# Patient Record
Sex: Female | Born: 1970 | Race: Black or African American | Hispanic: No | Marital: Married | Smoking: Never smoker
Health system: Southern US, Community
[De-identification: ages and names within clinical notes are randomized; demographics above are authoritative.]

## PROBLEM LIST (undated history)

## (undated) DIAGNOSIS — E559 Vitamin D deficiency, unspecified: Secondary | ICD-10-CM

## (undated) DIAGNOSIS — D649 Anemia, unspecified: Secondary | ICD-10-CM

## (undated) DIAGNOSIS — E785 Hyperlipidemia, unspecified: Secondary | ICD-10-CM

## (undated) DIAGNOSIS — M1990 Unspecified osteoarthritis, unspecified site: Secondary | ICD-10-CM

## (undated) DIAGNOSIS — I10 Essential (primary) hypertension: Secondary | ICD-10-CM

## (undated) DIAGNOSIS — K219 Gastro-esophageal reflux disease without esophagitis: Secondary | ICD-10-CM

## (undated) DIAGNOSIS — R42 Dizziness and giddiness: Secondary | ICD-10-CM

## (undated) DIAGNOSIS — O009 Unspecified ectopic pregnancy without intrauterine pregnancy: Secondary | ICD-10-CM

## (undated) HISTORY — DX: Vitamin D deficiency, unspecified: E55.9

## (undated) HISTORY — PX: SALPINGECTOMY: SHX328

## (undated) HISTORY — DX: Dizziness and giddiness: R42

## (undated) HISTORY — PX: OTHER SURGICAL HISTORY: SHX169

## (undated) HISTORY — DX: Essential (primary) hypertension: I10

## (undated) HISTORY — PX: TUBAL LIGATION: SHX77

## (undated) HISTORY — DX: Unspecified ectopic pregnancy without intrauterine pregnancy: O00.90

## (undated) HISTORY — DX: Hyperlipidemia, unspecified: E78.5

---

## 2012-03-17 HISTORY — PX: POLYPECTOMY: SHX149

## 2012-03-17 HISTORY — PX: HYSTEROSCOPY: SHX211

## 2016-07-23 ENCOUNTER — Encounter: Payer: Self-pay | Admitting: Certified Nurse Midwife

## 2016-07-23 ENCOUNTER — Ambulatory Visit (INDEPENDENT_AMBULATORY_CARE_PROVIDER_SITE_OTHER): Payer: 59 | Admitting: Certified Nurse Midwife

## 2016-07-23 VITALS — BP 118/82 | HR 87 | Ht 66.0 in | Wt 202.0 lb

## 2016-07-23 DIAGNOSIS — Z01419 Encounter for gynecological examination (general) (routine) without abnormal findings: Secondary | ICD-10-CM | POA: Diagnosis not present

## 2016-07-23 DIAGNOSIS — R42 Dizziness and giddiness: Secondary | ICD-10-CM | POA: Insufficient documentation

## 2016-07-23 DIAGNOSIS — Z1231 Encounter for screening mammogram for malignant neoplasm of breast: Secondary | ICD-10-CM | POA: Diagnosis not present

## 2016-07-23 DIAGNOSIS — Z124 Encounter for screening for malignant neoplasm of cervix: Secondary | ICD-10-CM | POA: Diagnosis not present

## 2016-07-23 DIAGNOSIS — I10 Essential (primary) hypertension: Secondary | ICD-10-CM | POA: Insufficient documentation

## 2016-07-23 DIAGNOSIS — E559 Vitamin D deficiency, unspecified: Secondary | ICD-10-CM | POA: Insufficient documentation

## 2016-07-23 DIAGNOSIS — E785 Hyperlipidemia, unspecified: Secondary | ICD-10-CM | POA: Insufficient documentation

## 2016-07-23 DIAGNOSIS — Z1239 Encounter for other screening for malignant neoplasm of breast: Secondary | ICD-10-CM

## 2016-07-23 MED ORDER — HYDROCHLOROTHIAZIDE 12.5 MG PO TABS: 12.5000 mg | ORAL_TABLET | Freq: Every day | ORAL | 1 refills | Status: AC

## 2016-07-27 NOTE — Progress Notes (Signed)
Gynecology Annual Exam  PCP: System, Pcp Not In  Chief Complaint:  Chief Complaint  Patient presents with  . Gynecologic Exam    new pt    History of Present Illness: Elizabeth Terry or "[name] is a 46 y.o. [id number] BF who presents for a NP annual exam. The patient has recently moved to the area and is establishing care at [facility].   Last year she reports being amenorrheic. Since the start of this year, her menses have been regular, they occur every month, and they last 4-5 days. Her flow is moderate. She does not have intermenstrual bleeding. Her last menstrual period was 74 April 18. She has dysmenorrhea with most of her cramping on the left lower quadrant. Midol and rest help relieve the pain. She also has headaches associated with her menses. Last pap smear: 04/24/2014, results were negative per patient report. She denies a history of abnormal Pap smears.   The patient is sexually active. She currently uses nothing for contraception. She has a history of infertility and conceived with her last child via IVF. Had a BTL followed by a tubal reanastamosis, then had an ectopic pregnancy and had a left salpingectomy. She does not have dyspareunia.    Her past medical history is remarkable for hyperlipidemia, hypertension, GERD, vertigo, and vitamin D deficiency. She is needing refills of her HCTZ until she establishes care with a PCP.  The patient does perform self breast exams. Her last mammogram was last year, results were normal..   There is no family history of breast cancer.    There is no family history of ovarian cancer.   The patient denies smoking.  She reports drinking alcohol. She reports have occasional glass of wine drinks per week.   She denies illegal drug use.  The patient reports exercising regularly by walking  The patient denies current symptoms of depression.    Review of Systems: ROS  Past Medical History:  Past Medical History:  Diagnosis Date  .  Ectopic pregnancy    tube removed  . Hyperlipidemia   . Hypertension   . Vertigo   . Vitamin D deficiency     Past Surgical History:  Past Surgical History:  Procedure Laterality Date  . HYSTEROSCOPY  03/17/2012  . POLYPECTOMY  03/17/2012   cervical  . SALPINGECTOMY Left    for left ectopic  . TUBAL LIGATION     done in 1994  . tubal reversal      Family History:  Family History  Problem Relation Age of Onset  . Hypertension Mother   . Hyperlipidemia Mother   . Liver cancer Father        died in his 80s  . Prostate cancer Father   . Hypertension Maternal Grandmother     Social History:  Social History   Social History  . Marital status: Married    Spouse name: N/A  . Number of children: 3  . Years of education: N/A   Occupational History  . CNA    Social History Main Topics  . Smoking status: Never Smoker  . Smokeless tobacco: Never Used  . Alcohol use Yes     Comment: occasional glass of wine  . Drug use: No  . Sexual activity: Yes    Birth control/ protection: None   Other Topics Concern  . Not on file   Social History Narrative  . No narrative on file    Allergies:  Allergies  Allergen Reactions  .  Aspirin Nausea And Vomiting    Medications: Prior to Admission medications   Medication Sig Start Date End Date Taking? Authorizing Provider  atorvastatin (LIPITOR) 20 MG tablet Take 20 mg by mouth daily.   Yes [provider]  meclizine (ANTIVERT) 25 MG tablet meclizine 25 mg tablet  Take 1 tablet(s) 3 times a day by oral route with meals.   Yes [provider]  Omeprazole 20 MG TBEC omeprazole 20 mg capsule,delayed release   Yes [provider]  Vitamin D, Ergocalciferol, (DRISDOL) 50000 units CAPS capsule Take 50,000 Units by mouth every 7 (seven) days.   Yes [provider]  hydrochlorothiazide (HYDRODIURIL) 12.5 MG tablet Take 1 tablet (12.5 mg total) by mouth daily. 07/23/16   Dalia Heading, CNM     Physical Exam Vitals: Blood pressure 118/82, pulse 87, height 5\' 6"  (1.676 m), weight 202 lb (91.6 kg), last menstrual period 06/21/2016.  General: NAD HEENT: normocephalic, anicteric Neck: no thyroid enlargement, no palpable nodules, no cervical lymphadenopathy  Pulmonary: No increased work of breathing, CTAB Cardiovascular: RRR, without murmur  Breast: Breast symmetrical, no tenderness, no palpable nodules or masses, no skin or nipple retraction present, no nipple discharge.  No axillary, infraclavicular or supraclavicular lymphadenopathy. Abdomen: Soft, non-tender, non-distended.  Umbilicus without lesions.  No hepatomegaly or masses palpable. No evidence of hernia. Genitourinary:  External: Normal external female genitalia.  Normal urethral meatus, normal Bartholin's and Skene's glands.    Vagina: Normal vaginal mucosa, no evidence of prolapse, blood tinged vaginal discharge    Cervix: Grossly normal in appearance, non-tender  Uterus: RF, normal size, shape, and consistency, decreased mobility, and non-tender  Adnexa: No adnexal masses, non-tender  Rectal: deferred  Lymphatic: no evidence of inguinal lymphadenopathy Extremities: no edema, erythema, or tenderness Neurologic: Grossly intact Psychiatric: mood appropriate, affect full     Assessment: 46 y.o. [id number] well woman exam  Plan:  1) Breast cancer screening - recommend monthly self breast exam. Mammogram was ordered today.  2) STI screening was offered and declined.  3) Cervical cancer screening - Pap was done.   4) Contraception -declines  5) Given names of some PCPs accepting new patients. Refill on HCTZ sent to pharmacy.  Dalia Heading, CNM

## 2016-07-28 ENCOUNTER — Encounter: Payer: Self-pay | Admitting: Certified Nurse Midwife

## 2016-07-29 LAB — IGP, APTIMA HPV
HPV Aptima: NEGATIVE
PAP SMEAR COMMENT: 0

## 2016-09-17 ENCOUNTER — Other Ambulatory Visit: Payer: Self-pay | Admitting: Certified Nurse Midwife

## 2016-09-29 DIAGNOSIS — M779 Enthesopathy, unspecified: Secondary | ICD-10-CM

## 2016-09-29 DIAGNOSIS — M778 Other enthesopathies, not elsewhere classified: Secondary | ICD-10-CM | POA: Insufficient documentation

## 2016-09-29 DIAGNOSIS — R42 Dizziness and giddiness: Secondary | ICD-10-CM | POA: Insufficient documentation

## 2016-09-29 DIAGNOSIS — K219 Gastro-esophageal reflux disease without esophagitis: Secondary | ICD-10-CM | POA: Insufficient documentation

## 2017-08-17 ENCOUNTER — Ambulatory Visit: Payer: BLUE CROSS/BLUE SHIELD | Admitting: Obstetrics and Gynecology

## 2017-08-17 ENCOUNTER — Encounter: Payer: Self-pay | Admitting: Obstetrics and Gynecology

## 2017-08-17 VITALS — BP 128/80 | Ht 66.0 in | Wt 196.0 lb

## 2017-08-17 DIAGNOSIS — N939 Abnormal uterine and vaginal bleeding, unspecified: Secondary | ICD-10-CM

## 2017-08-17 DIAGNOSIS — R102 Pelvic and perineal pain: Secondary | ICD-10-CM | POA: Diagnosis not present

## 2017-08-17 DIAGNOSIS — N760 Acute vaginitis: Secondary | ICD-10-CM

## 2017-08-17 DIAGNOSIS — R300 Dysuria: Secondary | ICD-10-CM

## 2017-08-17 DIAGNOSIS — N959 Unspecified menopausal and perimenopausal disorder: Secondary | ICD-10-CM

## 2017-08-17 LAB — POCT URINALYSIS DIPSTICK
Bilirubin, UA: NEGATIVE
Blood, UA: POSITIVE
GLUCOSE UA: NEGATIVE
KETONES UA: NEGATIVE
Nitrite, UA: NEGATIVE
Protein, UA: NEGATIVE
SPEC GRAV UA: 1.02 (ref 1.010–1.025)
Urobilinogen, UA: 0.2 E.U./dL
pH, UA: 6.5 (ref 5.0–8.0)

## 2017-08-17 MED ORDER — SULFAMETHOXAZOLE-TRIMETHOPRIM 800-160 MG PO TABS: 1.0000 | ORAL_TABLET | Freq: Two times a day (BID) | ORAL | 0 refills | Status: AC

## 2017-08-17 MED ORDER — METRONIDAZOLE 500 MG PO TABS: 500.0000 mg | ORAL_TABLET | Freq: Two times a day (BID) | ORAL | 0 refills | Status: AC

## 2017-08-17 NOTE — Progress Notes (Signed)
   Patient ID: Elizabeth Terry, female   DOB: Jul 01, 1970, 47 y.o.   MRN: [id number]  Reason for Consult: Pelvic Pain and Vaginal Pain   Referred by No ref. provider found  Subjective:     HPI:  Elizabeth Terry is a 47 y.o. female. She reports perimenopausal symptoms, irregular bleeding, left sided pelvic pain, and low sexual desire  Past Medical History:  Diagnosis Date  . Ectopic pregnancy    tube removed  . Hyperlipidemia   . Hypertension   . Vertigo   . Vitamin D deficiency    Family History  Problem Relation Age of Onset  . Hypertension Mother   . Hyperlipidemia Mother   . Liver cancer Father        died in his 14s  . Prostate cancer Father   . Hypertension Maternal Grandmother    Past Surgical History:  Procedure Laterality Date  . HYSTEROSCOPY  03/17/2012  . POLYPECTOMY  03/17/2012   cervical  . SALPINGECTOMY Left    for left ectopic  . TUBAL LIGATION     done in 1994  . tubal reversal      Short Social History:  Social History   Tobacco Use  . Smoking status: Never Smoker  . Smokeless tobacco: Never Used  Substance Use Topics  . Alcohol use: Yes    Comment: occasional glass of wine    Allergies  Allergen Reactions  . Aspirin Nausea And Vomiting    Current Outpatient Medications  Medication Sig Dispense Refill  . atorvastatin (LIPITOR) 20 MG tablet Take 20 mg by mouth daily.    . hydrochlorothiazide (HYDRODIURIL) 12.5 MG tablet Take 1 tablet (12.5 mg total) by mouth daily. 30 tablet 1  . meclizine (ANTIVERT) 25 MG tablet meclizine 25 mg tablet  Take 1 tablet(s) 3 times a day by oral route with meals.    . Omeprazole 20 MG TBEC omeprazole 20 mg capsule,delayed release    . Vitamin D, Ergocalciferol, (DRISDOL) 50000 units CAPS capsule Take 50,000 Units by mouth every 7 (seven) days.     No current facility-administered medications for this visit.     REVIEW OF SYSTEMS      Objective:  Objective   Vitals:   08/17/17 0830  BP: 128/80   Weight: 196 lb (88.9 kg)  Height: 5\' 6"  (1.676 m)   Body mass index is 31.64 kg/m.  Physical Exam     Assessment/Plan:    47 yo  Nuswab VG + Low sexual desire and Perimenopausal symptoms- check fsh and estradiol Irregular bleeding and left sided pelvic pain- follow up for pelvic US, urine culture and nuswab   Adrian Prows MD [place], [employer] [facility] 08/17/17 9:04 AM

## 2017-08-18 ENCOUNTER — Telehealth: Payer: Self-pay | Admitting: Obstetrics and Gynecology

## 2017-08-18 LAB — ESTRADIOL: Estradiol: 58 pg/mL

## 2017-08-18 LAB — FOLLICLE STIMULATING HORMONE: FSH: 39.4 m[IU]/mL

## 2017-08-18 NOTE — Telephone Encounter (Signed)
Attempt to reach patient to schedule TVUS and follow up with Dr. Gilman Schmidt . Patient's voicemail box is full unable to leave message for patient to call back

## 2017-08-18 NOTE — Telephone Encounter (Signed)
Patient is schedule 06/24/17 at 4pm

## 2017-08-19 LAB — URINE CULTURE: Organism ID, Bacteria: NO GROWTH

## 2017-08-22 LAB — NUSWAB VAGINITIS PLUS (VG+)
CHLAMYDIA TRACHOMATIS, NAA: NEGATIVE
Candida albicans, NAA: NEGATIVE
Candida glabrata, NAA: NEGATIVE
Neisseria gonorrhoeae, NAA: NEGATIVE
Trich vag by NAA: POSITIVE — AB

## 2017-08-23 NOTE — Progress Notes (Signed)
Spoke with patient on phone about her test results. She and her partner are both currently being treated for trichomoniasis. Discussed refraining from intercourse for 2 weeks to prevent reinfection. Possible her light pink spotting is related to this infection. She does appear to be in menopause which is consistent with her lack of periods for over a year and a half.

## 2017-08-24 ENCOUNTER — Ambulatory Visit (INDEPENDENT_AMBULATORY_CARE_PROVIDER_SITE_OTHER): Payer: BLUE CROSS/BLUE SHIELD

## 2017-08-24 ENCOUNTER — Encounter: Payer: Self-pay | Admitting: Obstetrics and Gynecology

## 2017-08-24 ENCOUNTER — Ambulatory Visit (INDEPENDENT_AMBULATORY_CARE_PROVIDER_SITE_OTHER): Payer: BLUE CROSS/BLUE SHIELD | Admitting: Obstetrics and Gynecology

## 2017-08-24 VITALS — BP 130/80 | Ht 66.0 in | Wt 195.0 lb

## 2017-08-24 DIAGNOSIS — N760 Acute vaginitis: Secondary | ICD-10-CM | POA: Diagnosis not present

## 2017-08-24 DIAGNOSIS — N959 Unspecified menopausal and perimenopausal disorder: Secondary | ICD-10-CM

## 2017-08-24 DIAGNOSIS — N83209 Unspecified ovarian cyst, unspecified side: Secondary | ICD-10-CM | POA: Diagnosis not present

## 2017-08-24 DIAGNOSIS — N939 Abnormal uterine and vaginal bleeding, unspecified: Secondary | ICD-10-CM

## 2017-08-24 DIAGNOSIS — A599 Trichomoniasis, unspecified: Secondary | ICD-10-CM | POA: Diagnosis not present

## 2017-08-24 NOTE — Progress Notes (Signed)
Patient ID: Elizabeth Terry, female   DOB: [birth year], 47 y.o.   MRN: [id number]  Reason for Consult: Follow-up   Referred by Adrian Prows R, *  Subjective:     HPI:  Elizabeth Terry is a 47 y.o. female  She was seen recently for complaints of postmenopausal bleeding and pelvic pain.  She was evaluated and found to have an infection with trichomonas. She is doing well today without vaginal bleeding since at least May.  Past Medical History:  Diagnosis Date  . Ectopic pregnancy    tube removed  . Hyperlipidemia   . Hypertension   . Vertigo   . Vitamin D deficiency    Family History  Problem Relation Age of Onset  . Hypertension Mother   . Hyperlipidemia Mother   . Liver cancer Father        died in his 52s  . Prostate cancer Father   . Hypertension Maternal Grandmother    Past Surgical History:  Procedure Laterality Date  . HYSTEROSCOPY  03/17/2012  . POLYPECTOMY  03/17/2012   cervical  . SALPINGECTOMY Left    for left ectopic  . TUBAL LIGATION     done in 1994  . tubal reversal      Short Social History:  Social History   Tobacco Use  . Smoking status: Never Smoker  . Smokeless tobacco: Never Used  Substance Use Topics  . Alcohol use: Yes    Comment: occasional glass of wine    Allergies  Allergen Reactions  . Aspirin Nausea And Vomiting    Current Outpatient Medications  Medication Sig Dispense Refill  . atorvastatin (LIPITOR) 20 MG tablet Take 20 mg by mouth daily.    . hydrochlorothiazide (HYDRODIURIL) 12.5 MG tablet Take 1 tablet (12.5 mg total) by mouth daily. 30 tablet 1  . meclizine (ANTIVERT) 25 MG tablet meclizine 25 mg tablet  Take 1 tablet(s) 3 times a day by oral route with meals.    . metroNIDAZOLE (FLAGYL) 500 MG tablet Take 1 tablet (500 mg total) by mouth 2 (two) times daily for 7 days. 14 tablet 0  . Omeprazole 20 MG TBEC omeprazole 20 mg capsule,delayed release    . Vitamin D, Ergocalciferol, (DRISDOL) 50000 units CAPS capsule  Take 50,000 Units by mouth every 7 (seven) days.     No current facility-administered medications for this visit.     Review of Systems  Constitutional: Negative for chills, fatigue, fever and unexpected weight change.  HENT: Negative for trouble swallowing.  Eyes: Negative for loss of vision.  Respiratory: Negative for cough, shortness of breath and wheezing.  Cardiovascular: Negative for chest pain, leg swelling, palpitations and syncope.  GI: Negative for abdominal pain, blood in stool, diarrhea, nausea and vomiting.  GU: Negative for difficulty urinating, dysuria, frequency and hematuria.  Musculoskeletal: Negative for back pain, leg pain and joint pain.  Skin: Negative for rash.  Neurological: Negative for dizziness, headaches, light-headedness, numbness and seizures.  Psychiatric: Negative for behavioral problem, confusion, depressed mood and sleep disturbance.        Objective:  Objective   Vitals:   08/24/17 1649  BP: 130/80  Weight: 195 lb (88.5 kg)  Height: 5\' 6"  (1.676 m)   Body mass index is 31.47 kg/m.  Physical Exam  Constitutional: She is oriented to person, place, and time. She appears well-developed and well-nourished.  HENT:  Head: Normocephalic and atraumatic.  Eyes: Pupils are equal, round, and reactive to light. EOM are normal.  Cardiovascular:  Normal rate and regular rhythm.  Pulmonary/Chest: Effort normal. No respiratory distress.  Abdominal: Soft.  Neurological: She is alert and oriented to person, place, and time.  Skin: Skin is warm and dry.  Psychiatric: She has a normal mood and affect. Her behavior is normal. Judgment and thought content normal.  Nursing note and vitals reviewed.   US Transvaginal Non-ob  Result Date: 08/24/2017 ULTRASOUND REPORT Location: [facility] Date of Service: 08/24/2017 Indications:AUB Findings: The uterus is retroverted and measures 8.14 x 4.61 x 4.16 cm. Echo texture is heterogenous with evidence of focal  masses. Within the uterus is a suspected fibroid measuring: Fibroid 1: 1.25 x 1.02 cm, Right fundal, Intramural The Endometrium measures 2.58 mm. Right Ovary measures 4.97 x 3.32 x 3.03 cm and contains a simple cyst measuring 3.14 x 2.91 x 2.33 cm. Left Ovary measures 2.71 x 2.44 x 1.82 cm. It is normal in appearance. Survey of the adnexa demonstrates no adnexal masses. There is trace free fluid in the cul de sac. Impression: 1. Retroverted fibroid uterus 2. Right fundal, intramural fibroid measures 1.25 x 1.02 cm 3. Right ovarian simple cyst measures 3.14 x 2.91 x 2.33 cm 4. Trace free fluid in the cul de sac. Recommendations: 1.Clinical correlation with the patient's History and Physical Exam. 2. Follow up in 3 months for repeat ultrasound of simple ovarian cyst. Dario Ave, RDMS I have reviewed this ultrasound and the report. I agree with the above assessment and plan. [facility] 08/24/17 5:56 PM         Assessment/Plan:    47yo with postmenopausal bleeding likely secondary to infection.  She has started treatment for the trichomonas infection.  Her partner is also being treated.  She understands that she should refrain from intercourse for 2 weeks to prevent reinfection with this organism.  She had a pelvic ultrasound today that confirmed that her uterine lining was thin being 2.5 mm.  This makes any source of uterine cancer for her bleeding very unlikely.  If any bleeding is persistent after her infection has been treated she will return to care.  Pelvic ultrasound today also found a simple cyst on her ovary.  We will follow this up with a repeat ultrasound in 3 months at that time she will also be tested for trichomonas to make sure that she is clear this infection.  More than 25 minutes were spent face to face with the patient in the room with more than 50% of the time spent providing counseling and discussing the plan of management. We discussed  the results of her evaluation, causes of postmenopausal bleeding, and follow up for her simple cyst.    Adrian Prows MD [place] 08/24/17 7:18 PM

## 2017-08-26 ENCOUNTER — Telehealth: Payer: Self-pay | Admitting: Obstetrics and Gynecology

## 2017-08-26 NOTE — Telephone Encounter (Signed)
Patient is calling due to missed call please advise  

## 2017-11-01 ENCOUNTER — Encounter: Payer: Self-pay | Admitting: Podiatry

## 2017-11-01 ENCOUNTER — Ambulatory Visit (INDEPENDENT_AMBULATORY_CARE_PROVIDER_SITE_OTHER): Payer: BLUE CROSS/BLUE SHIELD

## 2017-11-01 ENCOUNTER — Other Ambulatory Visit: Payer: Self-pay | Admitting: Podiatry

## 2017-11-01 ENCOUNTER — Ambulatory Visit: Payer: BLUE CROSS/BLUE SHIELD | Admitting: Podiatry

## 2017-11-01 DIAGNOSIS — M722 Plantar fascial fibromatosis: Secondary | ICD-10-CM

## 2017-11-01 DIAGNOSIS — N841 Polyp of cervix uteri: Secondary | ICD-10-CM | POA: Insufficient documentation

## 2017-11-01 DIAGNOSIS — M79672 Pain in left foot: Secondary | ICD-10-CM | POA: Diagnosis not present

## 2017-11-01 DIAGNOSIS — N920 Excessive and frequent menstruation with regular cycle: Secondary | ICD-10-CM | POA: Insufficient documentation

## 2017-11-01 DIAGNOSIS — R102 Pelvic and perineal pain: Secondary | ICD-10-CM | POA: Insufficient documentation

## 2017-11-01 DIAGNOSIS — M79671 Pain in right foot: Secondary | ICD-10-CM

## 2017-11-01 MED ORDER — MELOXICAM 15 MG PO TABS: 15.0000 mg | ORAL_TABLET | Freq: Every day | ORAL | 1 refills | Status: AC

## 2017-11-03 NOTE — Progress Notes (Signed)
   Subjective: 47 year old female presenting today as a new patient with a chief complaint of intermittent pain of bilateral arches that began 5-6 years ago. She states she has flat feet and the pain is worse on the right foot. Standing for long periods of time and walking increases the pain. She has not done anything for treatment. Patient is here for further evaluation and treatment.   Past Medical History:  Diagnosis Date  . Ectopic pregnancy    tube removed  . Hyperlipidemia   . Hypertension   . Vertigo   . Vitamin D deficiency      Objective: Physical Exam General: The patient is alert and oriented x3 in no acute distress.  Dermatology: Skin is warm, dry and supple bilateral lower extremities. Negative for open lesions or macerations bilateral.   Vascular: Dorsalis Pedis and Posterior Tibial pulses palpable bilateral.  Capillary fill time is immediate to all digits.  Neurological: Epicritic and protective threshold intact bilateral.   Musculoskeletal: Tenderness to palpation to the plantar aspect of the bilateral heels along the plantar fascia. All other joints range of motion within normal limits bilateral. Strength 5/5 in all groups bilateral.   Radiographic exam: Normal osseous mineralization. Joint spaces preserved. No fracture/dislocation/boney destruction. No other soft tissue abnormalities or radiopaque foreign bodies.   Assessment: 1. plantar fasciitis bilateral feet  Plan of Care:  1. Patient evaluated. Xrays reviewed.   2. Injection of 0.5cc Celestone soluspan injected into the bilateral heels.  3. Rx for Meloxicam provided to patient.  4. Appointment with Liliane Channel for custom molded orthotics.  5. Return to clinic in 4 weeks.    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   Edrick Kins, DPM [facility]  Dr. Edrick Kins, DPM    [address], [place] [address]                Office [phone]  Fax [phone]

## 2017-11-23 ENCOUNTER — Ambulatory Visit (INDEPENDENT_AMBULATORY_CARE_PROVIDER_SITE_OTHER): Payer: BLUE CROSS/BLUE SHIELD | Admitting: Orthotics

## 2017-11-23 DIAGNOSIS — M722 Plantar fascial fibromatosis: Secondary | ICD-10-CM

## 2017-11-23 NOTE — Progress Notes (Signed)

## 2017-11-25 ENCOUNTER — Ambulatory Visit: Payer: BLUE CROSS/BLUE SHIELD | Admitting: Obstetrics and Gynecology

## 2017-11-25 ENCOUNTER — Encounter: Payer: Self-pay | Admitting: Obstetrics and Gynecology

## 2017-11-25 ENCOUNTER — Ambulatory Visit (INDEPENDENT_AMBULATORY_CARE_PROVIDER_SITE_OTHER): Payer: BLUE CROSS/BLUE SHIELD

## 2017-11-25 DIAGNOSIS — N83209 Unspecified ovarian cyst, unspecified side: Secondary | ICD-10-CM

## 2017-11-25 DIAGNOSIS — D251 Intramural leiomyoma of uterus: Secondary | ICD-10-CM | POA: Diagnosis not present

## 2017-11-29 ENCOUNTER — Ambulatory Visit: Payer: BLUE CROSS/BLUE SHIELD | Admitting: Podiatry

## 2017-11-29 ENCOUNTER — Encounter: Payer: Self-pay | Admitting: Podiatry

## 2017-11-29 DIAGNOSIS — M722 Plantar fascial fibromatosis: Secondary | ICD-10-CM | POA: Diagnosis not present

## 2017-11-29 NOTE — Progress Notes (Signed)
   Subjective: 47 year old female presenting today for follow up evaluation of plantar fasciitis of bilateral feet. She states the pain had improved until she went on vacation. She states once she returned the pain started again. She has been taking Meloxicam but states it does not help. Walking increases the pain. Patient is here for further evaluation and treatment.   Past Medical History:  Diagnosis Date  . Ectopic pregnancy    tube removed  . Hyperlipidemia   . Hypertension   . Vertigo   . Vitamin D deficiency      Objective: Physical Exam General: The patient is alert and oriented x3 in no acute distress.  Dermatology: Skin is warm, dry and supple bilateral lower extremities. Negative for open lesions or macerations bilateral.   Vascular: Dorsalis Pedis and Posterior Tibial pulses palpable bilateral.  Capillary fill time is immediate to all digits.  Neurological: Epicritic and protective threshold intact bilateral.   Musculoskeletal: Tenderness to palpation to the plantar aspect of the bilateral heels along the plantar fascia. All other joints range of motion within normal limits bilateral. Strength 5/5 in all groups bilateral.    Assessment: 1. plantar fasciitis bilateral feet  Plan of Care:  1. Patient evaluated.   2. Injection of 0.5cc Celestone soluspan injected into the bilateral heels.  3. Discontinue taking Meloxicam because it did not help.  4. Patient has been molded for custom orthotics.  5. Return to clinic in 4 weeks.    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   Edrick Kins, DPM [facility]  Dr. Edrick Kins, DPM    [address], [place] [address]                Office [phone]  Fax [phone]

## 2017-12-14 ENCOUNTER — Other Ambulatory Visit: Payer: BLUE CROSS/BLUE SHIELD | Admitting: Orthotics

## 2017-12-21 ENCOUNTER — Ambulatory Visit: Payer: BLUE CROSS/BLUE SHIELD | Admitting: Orthotics

## 2017-12-21 DIAGNOSIS — R102 Pelvic and perineal pain: Secondary | ICD-10-CM

## 2017-12-21 DIAGNOSIS — M722 Plantar fascial fibromatosis: Secondary | ICD-10-CM

## 2017-12-21 NOTE — Progress Notes (Signed)
Patient came in today to pick up custom made foot orthotics.  The goals were accomplished and the patient reported no dissatisfaction with said orthotics.  Patient was advised of breakin period and how to report any issues. 

## 2017-12-28 ENCOUNTER — Other Ambulatory Visit: Payer: BLUE CROSS/BLUE SHIELD | Admitting: Orthotics

## 2018-01-23 ENCOUNTER — Other Ambulatory Visit: Payer: Self-pay | Admitting: Internal Medicine

## 2018-01-23 DIAGNOSIS — R748 Abnormal levels of other serum enzymes: Secondary | ICD-10-CM

## 2018-01-27 ENCOUNTER — Ambulatory Visit
Admission: RE | Admit: 2018-01-27 | Discharge: 2018-01-27 | Disposition: A | Discharge status: Home / Self Care | Payer: BLUE CROSS/BLUE SHIELD | Source: Ambulatory Visit | Attending: Internal Medicine | Admitting: Internal Medicine

## 2018-01-27 DIAGNOSIS — R748 Abnormal levels of other serum enzymes: Secondary | ICD-10-CM | POA: Insufficient documentation

## 2018-02-13 ENCOUNTER — Encounter: Payer: Self-pay | Admitting: Podiatry

## 2018-02-13 ENCOUNTER — Ambulatory Visit (INDEPENDENT_AMBULATORY_CARE_PROVIDER_SITE_OTHER): Payer: BLUE CROSS/BLUE SHIELD | Admitting: Podiatry

## 2018-02-13 DIAGNOSIS — M25572 Pain in left ankle and joints of left foot: Secondary | ICD-10-CM | POA: Diagnosis not present

## 2018-02-13 DIAGNOSIS — M722 Plantar fascial fibromatosis: Secondary | ICD-10-CM

## 2018-02-13 NOTE — Progress Notes (Addendum)
This patient presents the office with chief complaint of painful heels both feet.  She was initially diagnosed with plantar fasciitis by Dr. Amalia Hailey and treated with injection therapy.  She says that the injections only lasted for 2 days before the pain returned.  Dr. Amalia Hailey also acquired orthoses for this patient to wear in her shoes.  She says that the pain continues to be present in her left foot more than her right foot.  She states that she experiences burning pain noted through the bottom of her right foot.  She says she is unable to take Mobic because the cause her stomach to become upset.  She says that her feet continue to worsen despite wearing her orthoses and changing her foot gear at work.  She says her feet become painful due to her walking at work.  She presents the office today requesting injections for the pain.  Vascular  Dorsalis pedis and posterior tibial pulses are palpable  B/L.  Capillary return  WNL.  Temperature gradient is  WNL.  Skin turgor  WNL  Sensorium  Senn Weinstein monofilament wire  WNL. Normal tactile sensation.  Nail Exam  Patient has normal nails with no evidence of bacterial or fungal infection.  Orthopedic  Exam  Muscle tone and muscle strength  WNL.  No limitations of motion feet  B/L.  No crepitus or joint effusion noted.  Foot type is unremarkable and digits show no abnormalities.  Prominent rear foot  revealing a medial bulge at the talonavicular joint.  Patient has palpable pain noted at the insertion of the plantar fascia left foot.  Patient also has severe pain upon palpation of the sinus tarsi which she was unaware of on her left foot. Ganglion over lateral malleolar left foot/ankle.  Skin  No open lesions.  Normal skin texture and turgor.    Plantar fasciitis left foot.  Sinus tarsitis left foot.  ROV.  Injection sinus tarsi left foot.  Injection at the insertion of the plantar fascia left foot.  Examined the orthoses that patient has in the car and they  seem appropriate for her foot type.  She was told to continue to work wearing her orthoses and to return to the office as needed for continued evaluation and treatment.  Gardiner Barefoot DPM

## 2018-03-14 DIAGNOSIS — E876 Hypokalemia: Secondary | ICD-10-CM | POA: Insufficient documentation

## 2018-12-13 ENCOUNTER — Other Ambulatory Visit: Payer: Self-pay

## 2018-12-13 ENCOUNTER — Telehealth: Payer: Self-pay | Admitting: Gastroenterology

## 2018-12-13 ENCOUNTER — Encounter: Payer: Self-pay | Admitting: Certified Nurse Midwife

## 2018-12-13 ENCOUNTER — Ambulatory Visit (INDEPENDENT_AMBULATORY_CARE_PROVIDER_SITE_OTHER): Payer: PRIVATE HEALTH INSURANCE | Admitting: Certified Nurse Midwife

## 2018-12-13 ENCOUNTER — Other Ambulatory Visit (HOSPITAL_COMMUNITY)
Admission: RE | Admit: 2018-12-13 | Discharge: 2018-12-13 | Disposition: A | Discharge status: Home / Self Care | Payer: PRIVATE HEALTH INSURANCE | Source: Ambulatory Visit | Attending: Certified Nurse Midwife | Admitting: Certified Nurse Midwife

## 2018-12-13 ENCOUNTER — Telehealth: Payer: Self-pay | Admitting: Certified Nurse Midwife

## 2018-12-13 VITALS — BP 120/80 | HR 96 | Ht 66.0 in | Wt 210.0 lb

## 2018-12-13 DIAGNOSIS — Z1231 Encounter for screening mammogram for malignant neoplasm of breast: Secondary | ICD-10-CM

## 2018-12-13 DIAGNOSIS — Z01419 Encounter for gynecological examination (general) (routine) without abnormal findings: Secondary | ICD-10-CM

## 2018-12-13 DIAGNOSIS — N898 Other specified noninflammatory disorders of vagina: Secondary | ICD-10-CM

## 2018-12-13 DIAGNOSIS — Z124 Encounter for screening for malignant neoplasm of cervix: Secondary | ICD-10-CM

## 2018-12-13 DIAGNOSIS — Z1211 Encounter for screening for malignant neoplasm of colon: Secondary | ICD-10-CM

## 2018-12-13 LAB — POCT WET PREP (WET MOUNT): Trichomonas Wet Prep HPF POC: ABSENT

## 2018-12-13 MED ORDER — TINIDAZOLE 500 MG PO TABS: 1000.0000 mg | ORAL_TABLET | Freq: Every day | ORAL | 0 refills | Status: AC

## 2018-12-13 NOTE — Telephone Encounter (Signed)
Pt aware.

## 2018-12-13 NOTE — Telephone Encounter (Signed)
Patient following up, Rx still not at pharmacy, if these could be sent asap.  [facility].

## 2018-12-13 NOTE — Telephone Encounter (Signed)
Returned patients call to schedule her for her colonoscopy.  Unable to contact her or LVM due to no answer.  Thanks [employer]

## 2018-12-13 NOTE — Telephone Encounter (Signed)
done

## 2018-12-13 NOTE — Telephone Encounter (Signed)
Pt left vm returning Michelle's call

## 2018-12-13 NOTE — Telephone Encounter (Signed)
Patient was seen today for annual. Patient is calling for the prescriptions to be sent in to [facility]. Please advise

## 2018-12-13 NOTE — Progress Notes (Signed)
Gynecology Annual Exam  PCP: Elizabeth Axe, MD  Chief Complaint:  Chief Complaint  Patient presents with  . Gynecologic Exam    premenopause; hot flashes    History of Present Illness: Elizabeth Terry or "Elizabeth Terry is a 48 y.o. [id number] BF who presents for a routine gyn exam.  She also reports noticing an intermittent vaginal odor since December 2019. No vaginal discharge or itching. She has been amenorrheic for over a year now.  Last pap smear: 07/23/2016, results were NIL/negative HRHPV. She denies a history of abnormal Pap smears.   The patient is sexually active. She currently uses nothing for contraception. She has a history of infertility and conceived with her last child via IVF. Had a BTL followed by a tubal reanastamosis, then had an ectopic pregnancy and had a left salpingectomy. She does not have dyspareunia. Since her last annual exam 07/23/2016, she was seen by Dr Elizabeth Terry last year for left sided pelvic pain and was noted to have a left ovarian cyst, that resolved on a subsequent pelvic ultrasound. She also had 2 small intramural fibroids   Her past medical history is remarkable for hyperlipidemia, hypertension, GERD, vertigo, and vitamin D deficiency.   The patient does perform self breast exams. Her last mammogram was about 3 years ago.  There is no family history of breast cancer.    There is no family history of ovarian cancer. Her father had colon cancer at age 15 and later died of liver cancer in his early 106s. She had a colonoscopy in her 25s for blood in her stool was was thought to be due to a stomach ulcer.   The patient denies smoking.  She reports drinking alcohol. She reports have occasional glass of wine drinks per week.   She denies illegal drug use.  The patient reports exercising regularly by walking  The patient denies current symptoms of depression.    Review of Systems: Review of Systems  Constitutional: Positive for malaise/fatigue. Negative  for chills, fever and weight loss.  HENT: Positive for congestion. Negative for sinus pain and sore throat.   Eyes: Negative for blurred vision and pain.  Respiratory: Negative for hemoptysis, shortness of breath and wheezing.   Cardiovascular: Negative for chest pain, palpitations and leg swelling.  Gastrointestinal: Positive for abdominal pain (epigastric and LUQ) and nausea. Negative for blood in stool, diarrhea, heartburn and vomiting.  Genitourinary: Negative for dysuria, frequency, hematuria and urgency.  Musculoskeletal: Negative for back pain, joint pain and myalgias.  Skin: Negative for itching and rash.  Neurological: Negative for dizziness, tingling and headaches.  Endo/Heme/Allergies: Positive for environmental allergies. Negative for polydipsia. Does not bruise/bleed easily.       Positive for hot flashes   Psychiatric/Behavioral: Negative for depression. The patient is nervous/anxious. The patient does not have insomnia.     Past Medical History:  Past Medical History:  Diagnosis Date  . Ectopic pregnancy    tube removed  . Hyperlipidemia   . Hypertension   . Vertigo   . Vitamin D deficiency     Past Surgical History:  Past Surgical History:  Procedure Laterality Date  . HYSTEROSCOPY  03/17/2012  . POLYPECTOMY  03/17/2012   cervical  . SALPINGECTOMY Left    for left ectopic  . TUBAL LIGATION     done in 1994  . tubal reversal      Family History:  Family History  Problem Relation Age of Onset  . Hypertension Mother   .  Hyperlipidemia Mother   . Kidney Stones Mother   . Liver cancer Father        died in his 73s  . Prostate cancer Father   . Colon cancer Father   . Hypertension Maternal Grandmother     Social History:  Social History   Socioeconomic History  . Marital status: Married    Spouse name: Not on file  . Number of children: 3  . Years of education: Not on file  . Highest education level: Not on file  Occupational History  .  Occupation: CNA  Social Needs  . Financial resource strain: Not on file  . Food insecurity    Worry: Not on file    Inability: Not on file  . Transportation needs    Medical: Not on file    Non-medical: Not on file  Tobacco Use  . Smoking status: Never Smoker  . Smokeless tobacco: Never Used  Substance and Sexual Activity  . Alcohol use: Yes    Comment: occasional glass of wine  . Drug use: No  . Sexual activity: Yes    Birth control/protection: None  Lifestyle  . Physical activity    Days per week: Not on file    Minutes per session: Not on file  . Stress: Not on file  Relationships  . Social Herbalist on phone: Not on file    Gets together: Not on file    Attends religious service: Not on file    Active member of club or organization: Not on file    Attends meetings of clubs or organizations: Not on file    Relationship status: Not on file  . Intimate partner violence    Fear of current or ex partner: Not on file    Emotionally abused: Not on file    Physically abused: Not on file    Forced sexual activity: Not on file  Other Topics Concern  . Not on file  Social History Narrative  . Not on file    Allergies:  Allergies  Allergen Reactions  . Grapefruit Extract Swelling  . Aspirin Nausea And Vomiting    Medications:  Current Outpatient Medications on File Prior to Visit  Medication Sig Dispense Refill  . hydrochlorothiazide (HYDRODIURIL) 12.5 MG tablet Take 1 tablet (12.5 mg total) by mouth daily. 30 tablet 1  . meclizine (ANTIVERT) 25 MG tablet meclizine 25 mg tablet  Take 1 tablet(s) 3 times a day by oral route with meals.    . Omeprazole 20 MG TBEC omeprazole 20 mg capsule,delayed release    . Vitamin D, Ergocalciferol, (DRISDOL) 50000 units CAPS capsule Take 50,000 Units by mouth every 7 (seven) days.     No current facility-administered medications on file prior to visit.     Physical Exam Vitals: BP 120/80   Pulse 96   Ht 5\' 6"  (1.676  m)   Wt 210 lb (95.3 kg)   BMI 33.89 kg/m   General: AAF in NAD HEENT: normocephalic, anicteric Neck: no thyroid enlargement, no palpable nodules, no cervical lymphadenopathy  Pulmonary: No increased work of breathing, CTAB Cardiovascular: RRR, without murmur  Breast: Breast symmetrical, no tenderness, no palpable nodules or masses, no skin or nipple retraction present, no nipple discharge.  No axillary, infraclavicular or supraclavicular lymphadenopathy. Abdomen: Soft,  non-distended.  Umbilicus without lesions.  No hepatomegaly or masses palpable. No evidence of hernia. Genitourinary:  External: Normal external female genitalia.  Normal urethral meatus, normal Bartholin's and Skene's  glands.    Vagina: Normal vaginal mucosa, no evidence of prolapse, white homogenous discharge    Cervix: Grossly normal in appearance, non-tender  Uterus: RF, normal size, shape, and consistency, decreased mobility, and non-tender  Adnexa: No adnexal masses, non-tender  Rectal: deferred  Lymphatic: no evidence of inguinal lymphadenopathy Extremities: no edema, erythema, or tenderness Neurologic: Grossly intact Psychiatric: mood appropriate, affect full  Wet prep: negative for hyphae and Trich and positive for many clue cells   Assessment: 48 y.o. [id number] well woman exam Bacterial vaginosis  Plan:  1) Breast cancer screening - recommend monthly self breast exam. Mammogram was ordered today.  2) Colon cancer screening/ family history of colon cancer: GI referral for colonoscopy  3) Cervical cancer screening - Pap was done.   4) Tindamax 1 GM po daily x 5 days ( no alcohol x 1 week)  5) RTO 1 year and prn.  Dalia Heading, CNM

## 2018-12-18 ENCOUNTER — Encounter: Payer: Self-pay | Admitting: Certified Nurse Midwife

## 2018-12-18 LAB — CYTOLOGY - PAP: Diagnosis: NEGATIVE

## 2018-12-27 ENCOUNTER — Other Ambulatory Visit: Payer: Self-pay

## 2018-12-27 ENCOUNTER — Telehealth: Payer: Self-pay

## 2018-12-27 NOTE — Telephone Encounter (Signed)
Gastroenterology Pre-Procedure Review  Request Date: PENDING PT TO CALL BACK TO SCHEDULE Requesting Physician: Dr. PENDING ([place])  PATIENT REVIEW QUESTIONS: The patient responded to the following health history questions as indicated:    1. Are you having any GI issues? no 2. Do you have a personal history of Polyps? no 3. Do you have a family history of Colon Cancer or Polyps? yes (Father had colon cancer) 4. Diabetes Mellitus? no 5. Joint replacements in the past 12 months?no 6. Major health problems in the past 3 months?no 7. Any artificial heart valves, MVP, or defibrillator?no    MEDICATIONS & ALLERGIES:    Patient reports the following regarding taking any anticoagulation/antiplatelet therapy:   Plavix, Coumadin, Eliquis, Xarelto, Lovenox, Pradaxa, Brilinta, or Effient? no Aspirin? no  Patient confirms/reports the following medications:  Current Outpatient Medications  Medication Sig Dispense Refill  . hydrochlorothiazide (HYDRODIURIL) 12.5 MG tablet Take 1 tablet (12.5 mg total) by mouth daily. 30 tablet 1  . meclizine (ANTIVERT) 25 MG tablet meclizine 25 mg tablet  Take 1 tablet(s) 3 times a day by oral route with meals.    . Omeprazole 20 MG TBEC omeprazole 20 mg capsule,delayed release    . Vitamin D, Ergocalciferol, (DRISDOL) 50000 units CAPS capsule Take 50,000 Units by mouth every 7 (seven) days.     No current facility-administered medications for this visit.     Patient confirms/reports the following allergies:  Allergies  Allergen Reactions  . Grapefruit Extract Swelling  . Aspirin Nausea And Vomiting    No orders of the defined types were placed in this encounter.   AUTHORIZATION INFORMATION Primary Insurance: 1D#: Group #:  Secondary Insurance: 1D#: Group #:  SCHEDULE INFORMATION: Date: PENDING Time: Location:[facility]

## 2018-12-29 ENCOUNTER — Encounter: Payer: Self-pay | Admitting: *Deleted

## 2019-04-18 ENCOUNTER — Other Ambulatory Visit: Payer: Self-pay | Admitting: Orthopedic Surgery

## 2019-04-18 DIAGNOSIS — M25561 Pain in right knee: Secondary | ICD-10-CM

## 2019-04-18 NOTE — Progress Notes (Signed)
Bilateral standing hip to ankle x-rays on one cassette for evaluation of alignment 

## 2019-04-23 ENCOUNTER — Ambulatory Visit
Admission: RE | Admit: 2019-04-23 | Discharge: 2019-04-23 | Disposition: A | Discharge status: Home / Self Care | Payer: No Typology Code available for payment source | Source: Ambulatory Visit | Attending: Orthopedic Surgery | Admitting: Orthopedic Surgery

## 2019-04-23 DIAGNOSIS — M25561 Pain in right knee: Secondary | ICD-10-CM

## 2019-05-23 ENCOUNTER — Other Ambulatory Visit: Payer: Self-pay | Admitting: Orthopedic Surgery

## 2019-05-28 ENCOUNTER — Other Ambulatory Visit: Payer: Self-pay

## 2019-05-28 ENCOUNTER — Encounter
Admission: RE | Admit: 2019-05-28 | Discharge: 2019-05-28 | Disposition: A | Discharge status: Home / Self Care | Payer: No Typology Code available for payment source | Source: Ambulatory Visit | Attending: Orthopedic Surgery | Admitting: Orthopedic Surgery

## 2019-05-28 DIAGNOSIS — Z01812 Encounter for preprocedural laboratory examination: Secondary | ICD-10-CM | POA: Insufficient documentation

## 2019-05-28 DIAGNOSIS — Z20822 Contact with and (suspected) exposure to covid-19: Secondary | ICD-10-CM | POA: Diagnosis not present

## 2019-05-28 HISTORY — DX: Unspecified osteoarthritis, unspecified site: M19.90

## 2019-05-28 HISTORY — DX: Anemia, unspecified: D64.9

## 2019-05-28 HISTORY — DX: Gastro-esophageal reflux disease without esophagitis: K21.9

## 2019-05-28 NOTE — Patient Instructions (Addendum)
INSTRUCTIONS FOR SURGERY     Your surgery is scheduled for:   Monday, MARCH 29TH     To find out your arrival time for the day of surgery,          please call [phone] between 1 pm and 3 pm on :  Friday, MARCH 26TH     When you arrive for surgery, report to the [place].       Do NOT stop on the first floor to register.    REMEMBER: Instructions that are not followed completely may result in serious medical risk,  up to and including death, or upon the discretion of your surgeon and anesthesiologist,            your surgery may need to be rescheduled.  __X__ 1. Do not eat food after midnight the night before your procedure.                    No gum, candy, lozenger, tic tacs, tums or hard candies.                  ABSOLUTELY NOTHING SOLID IN YOUR MOUTH AFTER MIDNIGHT                    You may drink unlimited clear liquids up to 2 hours before you are scheduled to arrive for surgery.                   Do not drink anything within those 2 hours unless you need to take medicine, then take the                   smallest amount you need.  Clear liquids include:  water, apple juice without pulp,                   any flavor Gatorade, Black coffee, black tea.  Sugar may be added but no dairy/ honey /lemon.                        Broth and jello is not considered a clear liquid.  __x__  2. On the morning of surgery, please brush your teeth with toothpaste and water. You may rinse with                  mouthwash if you wish but DO NOT SWALLOW TOOTHPASTE OR MOUTHWASH  __X___3. NO alcohol for 24 hours before or after surgery.  __x___ 4.  Do NOT smoke or use e-cigarettes for 24 HOURS PRIOR TO SURGERY.                      DO NOT  Use any chewable tobacco products for at least 6 hours prior to surgery.  __x___ 5. If you start any new medication after this appointment and prior to surgery, please                    Bring it with you on the day of surgery.  ___x__ 6. Notify your doctor if there is any change  in your medical condition, such as fever,                   infection, vomitting, diarrhea or any open sores.  __x___ 7.  USE the CHG SOAP as instructed, the night before surgery and the day of surgery.                   Once you have washed with this soap, do NOT use any of the following: Powders, perfumes                    or lotions. Please do not wear make up, hairpins, clips or nail polish. You  May wear deodorant.                   Men may shave their face and neck.  Women need to shave 48 hours prior to surgery.                   DO NOT wear ANY jewelry on the day of surgery. If there are rings that are too tight to                    remove easily, please address this prior to the surgery day. Piercings need to be removed.                                                                     NO METAL ON YOUR BODY.                    Do NOT bring any valuables.  If you came to Pre-Admit testing then you will not need license,                     insurance card or credit card.  If you will be staying overnight, please either leave your things in                     the car or have your family be responsible for these items.                     [facility] IS NOT RESPONSIBLE FOR BELONGINGS OR VALUABLES.  ___X__ 8. DO NOT wear contact lenses on surgery day.  You may not have dentures,                     Hearing aides, contacts or glasses in the operating room. These items can be                    Placed in the Recovery Room to receive immediately after surgery.  __x___ 9. IF YOU ARE SCHEDULED TO GO HOME ON THE SAME DAY, YOU MUST                   Have someone to drive you home and to stay with you  for the first 24 hours.                    Have an arrangement prior to arriving on surgery day.  ___x__ 10. Take the following  medications on the morning of surgery with a sip of water:                               1. Meclizine/antivert                     2. omeprazole                     3.                     _x____ 11.  Follow any instructions provided to you by your surgeon.                        Such as enema, clear liquid bowel prep                         ##PLEASE DRINK THE PRESURGICAL CARBOHYDRATE DRINK ON THE                              MORNING OF SURGERY. HAVE IT COMPLETED 2 HOURS PRIOR TO                             ARRIVAL TO [place].##  __X__  12. STOP ALL ASPIRIN PRODUCTS AS OF: TODAY, MARCH 22ND                       THIS INCLUDES BC POWDERS / GOODIES POWDER  __x___ 13. STOP Anti-inflammatories as of:  TODAY, MARCH 22ND                      This includes IBUPROFEN / MOTRIN / ADVIL / [place].  __X___ 50.  Stop supplements until after surgery.                     This includes: MULTIVITAMINS                 You may continue taking Vitamin B12 / Vitamin D3 but do not take on the morning of surgery.  __X____17.  Continue to take the following medications but do not take on the morning of surgery:                         HYDROCHLOROTHIAZIDE  _____18.   Wear clean and comfortable clothing to the hospital.                   [place].  PLEASE BRING BRACE WITH YOU TO THE HOSPITAL.  BRING IT IN WHEN YOU ARRIVE. BRING PHONE NUMBERS FOR YOUR CONTACT PEOPLE.

## 2019-05-31 ENCOUNTER — Other Ambulatory Visit: Payer: Self-pay

## 2019-05-31 ENCOUNTER — Other Ambulatory Visit
Admission: RE | Admit: 2019-05-31 | Discharge: 2019-05-31 | Disposition: A | Discharge status: Home / Self Care | Payer: No Typology Code available for payment source | Source: Ambulatory Visit | Attending: Orthopedic Surgery | Admitting: Orthopedic Surgery

## 2019-05-31 DIAGNOSIS — Z01812 Encounter for preprocedural laboratory examination: Secondary | ICD-10-CM | POA: Diagnosis not present

## 2019-05-31 LAB — SARS CORONAVIRUS 2 (TAT 6-24 HRS): SARS Coronavirus 2: NEGATIVE

## 2019-06-04 ENCOUNTER — Ambulatory Visit: Payer: No Typology Code available for payment source | Admitting: Anesthesiology

## 2019-06-04 ENCOUNTER — Encounter
Admission: RE | Disposition: A | Discharge status: Home / Self Care | Payer: Self-pay | Source: Home / Self Care | Attending: Orthopedic Surgery

## 2019-06-04 ENCOUNTER — Observation Stay: Payer: No Typology Code available for payment source

## 2019-06-04 ENCOUNTER — Ambulatory Visit: Payer: No Typology Code available for payment source

## 2019-06-04 ENCOUNTER — Other Ambulatory Visit: Payer: Self-pay

## 2019-06-04 ENCOUNTER — Encounter: Payer: Self-pay | Admitting: Orthopedic Surgery

## 2019-06-04 ENCOUNTER — Observation Stay
Admission: RE | Admit: 2019-06-04 | Discharge: 2019-06-05 | Disposition: A | Discharge status: Home / Self Care | Payer: No Typology Code available for payment source | Attending: Orthopedic Surgery | Admitting: Orthopedic Surgery

## 2019-06-04 DIAGNOSIS — X58XXXA Exposure to other specified factors, initial encounter: Secondary | ICD-10-CM | POA: Insufficient documentation

## 2019-06-04 DIAGNOSIS — M21161 Varus deformity, not elsewhere classified, right knee: Secondary | ICD-10-CM | POA: Insufficient documentation

## 2019-06-04 DIAGNOSIS — M21169 Varus deformity, not elsewhere classified, unspecified knee: Secondary | ICD-10-CM | POA: Diagnosis present

## 2019-06-04 DIAGNOSIS — I10 Essential (primary) hypertension: Secondary | ICD-10-CM | POA: Diagnosis not present

## 2019-06-04 DIAGNOSIS — M1990 Unspecified osteoarthritis, unspecified site: Secondary | ICD-10-CM | POA: Diagnosis not present

## 2019-06-04 DIAGNOSIS — Z9889 Other specified postprocedural states: Secondary | ICD-10-CM

## 2019-06-04 DIAGNOSIS — S83241A Other tear of medial meniscus, current injury, right knee, initial encounter: Secondary | ICD-10-CM | POA: Diagnosis not present

## 2019-06-04 DIAGNOSIS — E785 Hyperlipidemia, unspecified: Secondary | ICD-10-CM | POA: Insufficient documentation

## 2019-06-04 DIAGNOSIS — Z419 Encounter for procedure for purposes other than remedying health state, unspecified: Secondary | ICD-10-CM

## 2019-06-04 HISTORY — PX: KNEE ARTHROSCOPY WITH MENISCAL REPAIR: SHX5653

## 2019-06-04 SURGERY — ARTHROSCOPY, KNEE, WITH MENISCUS REPAIR
Anesthesia: General | Site: Knee | Laterality: Right

## 2019-06-04 MED ORDER — ROCURONIUM BROMIDE 100 MG/10ML IV SOLN
INTRAVENOUS | Status: DC | PRN
  Administered 2019-06-04: 40 mg via INTRAVENOUS
  Administered 2019-06-04: 20 mg via INTRAVENOUS

## 2019-06-04 MED ORDER — METHOCARBAMOL 500 MG PO TABS
500.0000 mg | ORAL_TABLET | Freq: Four times a day (QID) | ORAL | Status: DC | PRN
  Administered 2019-06-04: 500 mg via ORAL
  Filled 2019-06-04: qty 1

## 2019-06-04 MED ORDER — PANTOPRAZOLE SODIUM 40 MG PO TBEC
40.0000 mg | DELAYED_RELEASE_TABLET | Freq: Every day | ORAL | Status: DC
  Administered 2019-06-05: 40 mg via ORAL
  Filled 2019-06-04: qty 1

## 2019-06-04 MED ORDER — ONDANSETRON HCL 4 MG PO TABS: 4.0000 mg | ORAL_TABLET | Freq: Four times a day (QID) | ORAL | Status: DC | PRN

## 2019-06-04 MED ORDER — BUPIVACAINE HCL (PF) 0.5 % IJ SOLN
INTRAMUSCULAR | Status: AC
  Filled 2019-06-04: qty 30

## 2019-06-04 MED ORDER — ACETAMINOPHEN 10 MG/ML IV SOLN: 1000.0000 mg | Freq: Once | INTRAVENOUS | Status: DC | PRN

## 2019-06-04 MED ORDER — FAMOTIDINE 20 MG PO TABS: 20.0000 mg | ORAL_TABLET | Freq: Once | ORAL | Status: AC

## 2019-06-04 MED ORDER — DEXAMETHASONE SODIUM PHOSPHATE 4 MG/ML IJ SOLN
INTRAMUSCULAR | Status: AC
  Filled 2019-06-04: qty 1

## 2019-06-04 MED ORDER — VITAMIN D 25 MCG (1000 UNIT) PO TABS
1000.0000 [IU] | ORAL_TABLET | Freq: Every day | ORAL | Status: DC
  Administered 2019-06-04 – 2019-06-05 (×2): 1000 [IU] via ORAL
  Filled 2019-06-04 (×2): qty 1

## 2019-06-04 MED ORDER — KETAMINE HCL 10 MG/ML IJ SOLN
INTRAMUSCULAR | Status: DC | PRN
  Administered 2019-06-04: 20 mg via INTRAVENOUS
  Administered 2019-06-04 (×2): 10 mg via INTRAVENOUS

## 2019-06-04 MED ORDER — HYDROMORPHONE HCL 1 MG/ML IJ SOLN: 0.2500 mg | INTRAMUSCULAR | Status: DC | PRN

## 2019-06-04 MED ORDER — ACETAMINOPHEN 500 MG PO TABS
1000.0000 mg | ORAL_TABLET | Freq: Three times a day (TID) | ORAL | Status: DC
  Administered 2019-06-04 – 2019-06-05 (×3): 1000 mg via ORAL
  Filled 2019-06-04 (×3): qty 2

## 2019-06-04 MED ORDER — FENTANYL CITRATE (PF) 100 MCG/2ML IJ SOLN
INTRAMUSCULAR | Status: AC
  Filled 2019-06-04: qty 2

## 2019-06-04 MED ORDER — LIDOCAINE HCL (CARDIAC) PF 100 MG/5ML IV SOSY
PREFILLED_SYRINGE | INTRAVENOUS | Status: DC | PRN
  Administered 2019-06-04: 100 mg via INTRAVENOUS

## 2019-06-04 MED ORDER — ADULT MULTIVITAMIN W/MINERALS CH
1.0000 | ORAL_TABLET | Freq: Every day | ORAL | Status: DC
  Administered 2019-06-04 – 2019-06-05 (×2): 1 via ORAL
  Filled 2019-06-04 (×2): qty 1

## 2019-06-04 MED ORDER — OXYCODONE HCL 5 MG PO TABS: 2.5000 mg | ORAL_TABLET | ORAL | Status: DC | PRN

## 2019-06-04 MED ORDER — ASPIRIN EC 325 MG PO TBEC
325.0000 mg | DELAYED_RELEASE_TABLET | Freq: Every day | ORAL | Status: DC
  Administered 2019-06-05: 325 mg via ORAL
  Filled 2019-06-04: qty 1

## 2019-06-04 MED ORDER — PHENYLEPHRINE HCL (PRESSORS) 10 MG/ML IV SOLN
INTRAVENOUS | Status: DC | PRN
  Administered 2019-06-04: 150 ug via INTRAVENOUS
  Administered 2019-06-04: 100 ug via INTRAVENOUS
  Administered 2019-06-04 (×2): 150 ug via INTRAVENOUS

## 2019-06-04 MED ORDER — PROPOFOL 10 MG/ML IV BOLUS
INTRAVENOUS | Status: DC | PRN
  Administered 2019-06-04: 150 mg via INTRAVENOUS
  Administered 2019-06-04: 20 mg via INTRAVENOUS

## 2019-06-04 MED ORDER — BUPIVACAINE LIPOSOME 1.3 % IJ SUSP
INTRAMUSCULAR | Status: DC | PRN
  Administered 2019-06-04: 20 mL

## 2019-06-04 MED ORDER — DEXMEDETOMIDINE HCL 200 MCG/2ML IV SOLN
INTRAVENOUS | Status: DC | PRN
  Administered 2019-06-04: 20 ug via INTRAVENOUS

## 2019-06-04 MED ORDER — METHOCARBAMOL 1000 MG/10ML IJ SOLN
500.0000 mg | Freq: Four times a day (QID) | INTRAVENOUS | Status: DC | PRN
  Filled 2019-06-04: qty 5

## 2019-06-04 MED ORDER — ONDANSETRON HCL 4 MG/2ML IJ SOLN: 4.0000 mg | Freq: Four times a day (QID) | INTRAMUSCULAR | Status: DC | PRN

## 2019-06-04 MED ORDER — ACETAMINOPHEN 10 MG/ML IV SOLN
INTRAVENOUS | Status: AC
  Filled 2019-06-04: qty 100

## 2019-06-04 MED ORDER — FENTANYL CITRATE (PF) 100 MCG/2ML IJ SOLN
INTRAMUSCULAR | Status: AC
  Administered 2019-06-04: 25 ug via INTRAVENOUS
  Filled 2019-06-04: qty 2

## 2019-06-04 MED ORDER — FENTANYL CITRATE (PF) 100 MCG/2ML IJ SOLN
25.0000 ug | INTRAMUSCULAR | Status: DC | PRN
  Administered 2019-06-04: 50 ug via INTRAVENOUS
  Administered 2019-06-04: 25 ug via INTRAVENOUS

## 2019-06-04 MED ORDER — SUCCINYLCHOLINE CHLORIDE 200 MG/10ML IV SOSY
PREFILLED_SYRINGE | INTRAVENOUS | Status: AC
  Filled 2019-06-04: qty 10

## 2019-06-04 MED ORDER — CHLORHEXIDINE GLUCONATE 4 % EX LIQD
60.0000 mL | Freq: Once | CUTANEOUS | Status: AC
  Administered 2019-06-04: 4 via TOPICAL

## 2019-06-04 MED ORDER — DEXAMETHASONE SODIUM PHOSPHATE 4 MG/ML IJ SOLN
INTRAMUSCULAR | Status: DC | PRN
  Administered 2019-06-04: 4 mg

## 2019-06-04 MED ORDER — FENTANYL CITRATE (PF) 100 MCG/2ML IJ SOLN
INTRAMUSCULAR | Status: DC | PRN
  Administered 2019-06-04 (×3): 50 ug via INTRAVENOUS
  Administered 2019-06-04: 100 ug via INTRAVENOUS
  Administered 2019-06-04: 50 ug via INTRAVENOUS

## 2019-06-04 MED ORDER — FLEET ENEMA 7-19 GM/118ML RE ENEM: 1.0000 | ENEMA | Freq: Once | RECTAL | Status: DC | PRN

## 2019-06-04 MED ORDER — SUCCINYLCHOLINE CHLORIDE 20 MG/ML IJ SOLN
INTRAMUSCULAR | Status: DC | PRN
  Administered 2019-06-04: 100 mg via INTRAVENOUS

## 2019-06-04 MED ORDER — FENTANYL CITRATE (PF) 100 MCG/2ML IJ SOLN
INTRAMUSCULAR | Status: AC
  Administered 2019-06-04: 50 ug via INTRAVENOUS
  Filled 2019-06-04: qty 2

## 2019-06-04 MED ORDER — MIDAZOLAM HCL 2 MG/2ML IJ SOLN
INTRAMUSCULAR | Status: DC | PRN
  Administered 2019-06-04: 2 mg via INTRAVENOUS

## 2019-06-04 MED ORDER — BUPIVACAINE HCL (PF) 0.5 % IJ SOLN
INTRAMUSCULAR | Status: DC | PRN
  Administered 2019-06-04: 30 mL

## 2019-06-04 MED ORDER — BUPIVACAINE LIPOSOME 1.3 % IJ SUSP
INTRAMUSCULAR | Status: AC
  Filled 2019-06-04: qty 20

## 2019-06-04 MED ORDER — CEFAZOLIN SODIUM-DEXTROSE 2-4 GM/100ML-% IV SOLN
2.0000 g | INTRAVENOUS | Status: AC
  Administered 2019-06-04: 2 g via INTRAVENOUS

## 2019-06-04 MED ORDER — EPHEDRINE SULFATE 50 MG/ML IJ SOLN
INTRAMUSCULAR | Status: DC | PRN
  Administered 2019-06-04: 10 mg via INTRAVENOUS

## 2019-06-04 MED ORDER — BUPIVACAINE HCL (PF) 0.5 % IJ SOLN
INTRAMUSCULAR | Status: AC
  Filled 2019-06-04: qty 10

## 2019-06-04 MED ORDER — DOCUSATE SODIUM 100 MG PO CAPS
100.0000 mg | ORAL_CAPSULE | Freq: Two times a day (BID) | ORAL | Status: DC
  Administered 2019-06-04 – 2019-06-05 (×2): 100 mg via ORAL
  Filled 2019-06-04 (×2): qty 1

## 2019-06-04 MED ORDER — HYDROXYZINE HCL 10 MG PO TABS
10.0000 mg | ORAL_TABLET | Freq: Three times a day (TID) | ORAL | Status: DC | PRN
  Filled 2019-06-04: qty 1

## 2019-06-04 MED ORDER — HYDROMORPHONE HCL 1 MG/ML IJ SOLN
INTRAMUSCULAR | Status: AC
  Administered 2019-06-04: 0.5 mg via INTRAVENOUS
  Filled 2019-06-04: qty 1

## 2019-06-04 MED ORDER — LACTATED RINGERS IV SOLN: INTRAVENOUS | Status: DC

## 2019-06-04 MED ORDER — PROPOFOL 10 MG/ML IV BOLUS
INTRAVENOUS | Status: AC
  Filled 2019-06-04: qty 20

## 2019-06-04 MED ORDER — OXYCODONE HCL 5 MG/5ML PO SOLN: 5.0000 mg | Freq: Once | ORAL | Status: DC | PRN

## 2019-06-04 MED ORDER — METOCLOPRAMIDE HCL 5 MG/ML IJ SOLN
5.0000 mg | Freq: Three times a day (TID) | INTRAMUSCULAR | Status: DC | PRN
  Administered 2019-06-04: 10 mg via INTRAVENOUS
  Filled 2019-06-04: qty 2

## 2019-06-04 MED ORDER — FAMOTIDINE 20 MG PO TABS
ORAL_TABLET | ORAL | Status: AC
  Administered 2019-06-04: 20 mg via ORAL
  Filled 2019-06-04: qty 1

## 2019-06-04 MED ORDER — BUPIVACAINE HCL 0.5 % IJ SOLN
INTRAMUSCULAR | Status: DC | PRN
  Administered 2019-06-04: 30 mL

## 2019-06-04 MED ORDER — ROCURONIUM BROMIDE 10 MG/ML (PF) SYRINGE
PREFILLED_SYRINGE | INTRAVENOUS | Status: AC
  Filled 2019-06-04: qty 10

## 2019-06-04 MED ORDER — VITAMIN D (ERGOCALCIFEROL) 1.25 MG (50000 UNIT) PO CAPS
50000.0000 [IU] | ORAL_CAPSULE | ORAL | Status: DC
  Administered 2019-06-05: 50000 [IU] via ORAL
  Filled 2019-06-04 (×2): qty 1

## 2019-06-04 MED ORDER — ONDANSETRON HCL 4 MG/2ML IJ SOLN: 4.0000 mg | Freq: Once | INTRAMUSCULAR | Status: DC | PRN

## 2019-06-04 MED ORDER — BISACODYL 10 MG RE SUPP: 10.0000 mg | Freq: Every day | RECTAL | Status: DC | PRN

## 2019-06-04 MED ORDER — CEFAZOLIN SODIUM-DEXTROSE 2-4 GM/100ML-% IV SOLN
INTRAVENOUS | Status: AC
  Filled 2019-06-04: qty 100

## 2019-06-04 MED ORDER — LIDOCAINE-EPINEPHRINE 1 %-1:100000 IJ SOLN
INTRAMUSCULAR | Status: AC
  Filled 2019-06-04: qty 1

## 2019-06-04 MED ORDER — ONDANSETRON HCL 4 MG/2ML IJ SOLN
INTRAMUSCULAR | Status: DC | PRN
  Administered 2019-06-04: 4 mg via INTRAVENOUS

## 2019-06-04 MED ORDER — DEXAMETHASONE SODIUM PHOSPHATE 10 MG/ML IJ SOLN
INTRAMUSCULAR | Status: DC | PRN
  Administered 2019-06-04: 10 mg via INTRAVENOUS

## 2019-06-04 MED ORDER — SEVOFLURANE IN SOLN
RESPIRATORY_TRACT | Status: AC
  Filled 2019-06-04: qty 250

## 2019-06-04 MED ORDER — OXYCODONE HCL 5 MG PO TABS
5.0000 mg | ORAL_TABLET | ORAL | Status: DC | PRN
  Administered 2019-06-04 – 2019-06-05 (×3): 10 mg via ORAL
  Filled 2019-06-04 (×3): qty 2

## 2019-06-04 MED ORDER — CEFAZOLIN SODIUM-DEXTROSE 2-4 GM/100ML-% IV SOLN
2.0000 g | Freq: Four times a day (QID) | INTRAVENOUS | Status: AC
  Administered 2019-06-04 – 2019-06-05 (×3): 2 g via INTRAVENOUS
  Filled 2019-06-04 (×3): qty 100

## 2019-06-04 MED ORDER — HYDROCHLOROTHIAZIDE 25 MG PO TABS
12.5000 mg | ORAL_TABLET | Freq: Every day | ORAL | Status: DC
  Administered 2019-06-04 – 2019-06-05 (×2): 12.5 mg via ORAL
  Filled 2019-06-04 (×2): qty 1

## 2019-06-04 MED ORDER — SENNOSIDES-DOCUSATE SODIUM 8.6-50 MG PO TABS: 1.0000 | ORAL_TABLET | Freq: Every evening | ORAL | Status: DC | PRN

## 2019-06-04 MED ORDER — BUPIVACAINE HCL (PF) 0.5 % IJ SOLN
INTRAMUSCULAR | Status: AC
  Filled 2019-06-04: qty 20

## 2019-06-04 MED ORDER — METOCLOPRAMIDE HCL 10 MG PO TABS: 5.0000 mg | ORAL_TABLET | Freq: Three times a day (TID) | ORAL | Status: DC | PRN

## 2019-06-04 MED ORDER — OXYCODONE HCL 5 MG PO TABS: 5.0000 mg | ORAL_TABLET | Freq: Once | ORAL | Status: DC | PRN

## 2019-06-04 MED ORDER — ACETAMINOPHEN 10 MG/ML IV SOLN
INTRAVENOUS | Status: DC | PRN
  Administered 2019-06-04: 1000 mg via INTRAVENOUS

## 2019-06-04 MED ORDER — MIDAZOLAM HCL 2 MG/2ML IJ SOLN
INTRAMUSCULAR | Status: AC
  Filled 2019-06-04: qty 2

## 2019-06-04 MED ORDER — HYDROMORPHONE HCL 1 MG/ML IJ SOLN
0.2500 mg | INTRAMUSCULAR | Status: DC | PRN
  Administered 2019-06-04: 0.5 mg via INTRAVENOUS

## 2019-06-04 MED ORDER — TRAMADOL HCL 50 MG PO TABS: 50.0000 mg | ORAL_TABLET | Freq: Four times a day (QID) | ORAL | Status: DC | PRN

## 2019-06-04 MED ORDER — KETAMINE HCL 50 MG/ML IJ SOLN
INTRAMUSCULAR | Status: AC
  Filled 2019-06-04: qty 10

## 2019-06-04 MED ORDER — SODIUM CHLORIDE 0.9 % IV SOLN: INTRAVENOUS | Status: DC

## 2019-06-04 MED ORDER — MECLIZINE HCL 25 MG PO TABS
25.0000 mg | ORAL_TABLET | Freq: Three times a day (TID) | ORAL | Status: DC | PRN
  Filled 2019-06-04: qty 1

## 2019-06-04 MED ORDER — HYDROMORPHONE HCL 1 MG/ML IJ SOLN
INTRAMUSCULAR | Status: AC
  Filled 2019-06-04: qty 1

## 2019-06-04 SURGICAL SUPPLY — 71 items
ADAPTER IRRIG TUBE 2 SPIKE SOL (ADAPTER) ×4 IMPLANT
ANCHOR SUT BIO SW 4.75X19.1 (Anchor) ×2 IMPLANT
ANCHOR SUT HTO 4.5X32 (Anchor) ×4 IMPLANT
ANCHOR SUT HTO 6.5X22 (Anchor) ×4 IMPLANT
ANCHOR SWIVELOCK BIO COMP (Anchor) ×2 IMPLANT
BLADE SAGITTAL 25.0X1.19X90 (BLADE) ×2 IMPLANT
BLADE SAGITTAL AGGR TOOTH XLG (BLADE) ×2 IMPLANT
BLADE SURG SZ11 CARB STEEL (BLADE) ×2 IMPLANT
BNDG COHESIVE 6X5 TAN STRL LF (GAUZE/BANDAGES/DRESSINGS) ×2 IMPLANT
BNDG ESMARK 6X12 TAN STRL LF (GAUZE/BANDAGES/DRESSINGS) ×2 IMPLANT
BRUSH SCRUB EZ  4% CHG (MISCELLANEOUS) ×1
BRUSH SCRUB EZ 4% CHG (MISCELLANEOUS) ×1 IMPLANT
BUR RADIUS 3.5 (BURR) ×2 IMPLANT
BUR RADIUS 4.0X18.5 (BURR) ×2 IMPLANT
CHLORAPREP W/TINT 26 (MISCELLANEOUS) ×2 IMPLANT
COOLER POLAR GLACIER W/PUMP (MISCELLANEOUS) ×2 IMPLANT
COVER WAND RF STERILE (DRAPES) ×2 IMPLANT
CUFF TOURN SGL QUICK 24 (TOURNIQUET CUFF)
CUFF TOURN SGL QUICK 30 (TOURNIQUET CUFF) ×1
CUFF TRNQT CYL 24X4X16.5-23 (TOURNIQUET CUFF) IMPLANT
CUFF TRNQT CYL 30X4X21-28X (TOURNIQUET CUFF) ×1 IMPLANT
DERMABOND ADVANCED (GAUZE/BANDAGES/DRESSINGS) ×1
DERMABOND ADVANCED .7 DNX12 (GAUZE/BANDAGES/DRESSINGS) ×1 IMPLANT
DRAPE 3/4 80X56 (DRAPES) ×2 IMPLANT
DRAPE C-ARM XRAY 36X54 (DRAPES) ×2 IMPLANT
DRAPE C-ARMOR (DRAPES) ×2 IMPLANT
DRAPE IMP U-DRAPE 54X76 (DRAPES) ×2 IMPLANT
ELECT REM PT RETURN 9FT ADLT (ELECTROSURGICAL) ×2
ELECTRODE REM PT RTRN 9FT ADLT (ELECTROSURGICAL) ×1 IMPLANT
FILLER BONE VOID 7X3X30X12 (Miscellaneous) ×4 IMPLANT
GAUZE SPONGE 4X4 12PLY STRL (GAUZE/BANDAGES/DRESSINGS) ×2 IMPLANT
GLOVE BIOGEL PI IND STRL 8 (GLOVE) ×1 IMPLANT
GLOVE BIOGEL PI INDICATOR 8 (GLOVE) ×1
GLOVE SURG ORTHO 8.0 STRL STRW (GLOVE) ×2 IMPLANT
GLOVE SURG SYN 7.5  E (GLOVE) ×1
GLOVE SURG SYN 7.5 E (GLOVE) ×1 IMPLANT
GOWN STRL REUS W/ TWL LRG LVL3 (GOWN DISPOSABLE) ×1 IMPLANT
GOWN STRL REUS W/ TWL XL LVL3 (GOWN DISPOSABLE) ×1 IMPLANT
GOWN STRL REUS W/TWL LRG LVL3 (GOWN DISPOSABLE) ×1
GOWN STRL REUS W/TWL XL LVL3 (GOWN DISPOSABLE) ×1
IMPL SYS MENISCAL ROOT REPAIR (Orthopedic Implant) ×2 IMPLANT
IV LACTATED RINGER IRRG 3000ML (IV SOLUTION) ×7
IV LR IRRIG 3000ML ARTHROMATIC (IV SOLUTION) ×7 IMPLANT
KIT BIO-TENODESIS 3X8 DISP (MISCELLANEOUS) ×1
KIT INSRT BABSR STRL DISP BTN (MISCELLANEOUS) ×1 IMPLANT
KIT TURNOVER KIT A (KITS) ×2 IMPLANT
MANIFOLD NEPTUNE II (INSTRUMENTS) ×2 IMPLANT
MAT ABSORB  FLUID 56X50 GRAY (MISCELLANEOUS) ×2
MAT ABSORB FLUID 56X50 GRAY (MISCELLANEOUS) ×2 IMPLANT
NEEDLE HYPO 22GX1.5 SAFETY (NEEDLE) ×2 IMPLANT
PACK ARTHROSCOPY KNEE (MISCELLANEOUS) ×2 IMPLANT
PAD ABD DERMACEA PRESS 5X9 (GAUZE/BANDAGES/DRESSINGS) ×4 IMPLANT
PAD WRAPON POLAR KNEE (MISCELLANEOUS) ×1 IMPLANT
PADDING CAST 6X4YD NS (MISCELLANEOUS) ×1
PADDING CAST COTTON 6X4 NS (MISCELLANEOUS) ×1 IMPLANT
PENCIL SMOKE EVACUATOR (MISCELLANEOUS) ×2 IMPLANT
SCREW PA RELINE 2FS 5.5X50 (Screw) ×2 IMPLANT
SET TUBE SUCT SHAVER OUTFL 24K (TUBING) ×2 IMPLANT
SET TUBE TIP INTRA-ARTICULAR (MISCELLANEOUS) ×2 IMPLANT
SPONGE LAP 18X18 RF (DISPOSABLE) ×2 IMPLANT
SUT ETHILON 3-0 FS-10 30 BLK (SUTURE) ×2
SUT FIBERSNARE 2 CLSD LOOP (SUTURE) ×2 IMPLANT
SUT VIC AB 0 CT1 36 (SUTURE) ×4 IMPLANT
SUT VIC AB 2-0 CT1 (SUTURE) ×4 IMPLANT
SUTURE EHLN 3-0 FS-10 30 BLK (SUTURE) ×1 IMPLANT
SYR BULB IRRIG 60ML STRL (SYRINGE) ×2 IMPLANT
TAPE TRANSPORE STRL 2 31045 (GAUZE/BANDAGES/DRESSINGS) ×2 IMPLANT
TOWEL OR 17X26 4PK STRL BLUE (TOWEL DISPOSABLE) ×2 IMPLANT
TUBING ARTHRO INFLOW-ONLY STRL (TUBING) ×2 IMPLANT
WAND WEREWOLF FLOW 90D (MISCELLANEOUS) ×2 IMPLANT
WRAPON POLAR PAD KNEE (MISCELLANEOUS) ×2

## 2019-06-04 NOTE — H&P (Signed)
Paper H&P to be scanned into permanent record. H&P reviewed. No significant changes noted.  

## 2019-06-04 NOTE — Transfer of Care (Signed)
Immediate Anesthesia Transfer of Care Note  Patient: Elizabeth Terry  Procedure(s) Performed: RIGHT KNEE ARTHROSCOPY, MEDIAL MENISCUS ROOT REPAIR, CHONDROPLASTY, AND OPEN HIGH TIBIAL OSTEOTOMY (Right Knee)  Patient Location: PACU  Anesthesia Type:General  Level of Consciousness: awake, alert  and oriented  Airway & Oxygen Therapy: Patient Spontanous Breathing and Patient connected to face mask oxygen  Post-op Assessment: Report given to RN and Post -op Vital signs reviewed and stable  Post vital signs: Reviewed and stable  Last Vitals:  Vitals Value Taken Time  BP 138/84 06/04/19 1232  Temp 36.2 C 06/04/19 1232  Pulse 123 06/04/19 1235  Resp 18 06/04/19 1235  SpO2 100 % 06/04/19 1235  Vitals shown include unvalidated device data.  Last Pain:  Vitals:   06/04/19 0622  TempSrc: Temporal         Complications: No apparent anesthesia complications

## 2019-06-04 NOTE — Anesthesia Postprocedure Evaluation (Signed)
Anesthesia Post Note  Patient: Elizabeth Terry  Procedure(s) Performed: RIGHT KNEE ARTHROSCOPY, MEDIAL MENISCUS ROOT REPAIR, CHONDROPLASTY, AND OPEN HIGH TIBIAL OSTEOTOMY (Right Knee)  Patient location during evaluation: PACU Anesthesia Type: General Level of consciousness: awake and alert Pain management: pain level controlled Vital Signs Assessment: post-procedure vital signs reviewed and stable Respiratory status: spontaneous breathing, nonlabored ventilation, respiratory function stable and patient connected to nasal cannula oxygen Cardiovascular status: blood pressure returned to baseline and stable Postop Assessment: no apparent nausea or vomiting Anesthetic complications: no     Last Vitals:  Vitals:   06/04/19 1400 06/04/19 1415  BP: 127/85 (!) 127/92  Pulse: 91 92  Resp: 11 14  Temp:    SpO2: 95% 98%    Last Pain:  Vitals:   06/04/19 1415  TempSrc:   PainSc: 8                  Arita Miss

## 2019-06-04 NOTE — Anesthesia Procedure Notes (Signed)
Anesthesia Regional Block: Adductor canal block   Pre-Anesthetic Checklist: ,, timeout performed, Correct Patient, Correct Site, Correct Laterality, Correct Procedure, Correct Position, site marked, Risks and benefits discussed,  Surgical consent,  Pre-op evaluation,  At surgeon's request and post-op pain management  Laterality: Lower and Right  Prep: chloraprep       Needles:  Injection technique: Single-shot  Needle Type: Echogenic Needle     Needle Length: 9cm  Needle Gauge: 21     Additional Needles:   Procedures:,,,, ultrasound used (permanent image in chart),,,,  Narrative:  Start time: 06/04/2019 2:07 PM End time: 06/04/2019 2:15 PM Injection made incrementally with aspirations every 5 mL.  Performed by: Personally  Anesthesiologist: Arita Miss, MD  Additional Notes: Patient consented (prior to surgery) for risk and benefits of nerve block including but not limited to nerve damage, failed block, bleeding and infection.  Patient voiced understanding.  Functioning IV was confirmed and monitors were applied.  A echogenic needle was used. Sterile prep,hand hygiene and sterile gloves were used. Minimal sedation used for procedure.   No paresthesia endorsed by patient during the procedure.  Negative aspiration and negative test dose prior to incremental administration of local anesthetic. The patient tolerated the procedure well with no immediate complications.

## 2019-06-04 NOTE — OR Nursing (Signed)
PT states she started having acid reflux after drinking carb drink, pt given pepcid. Pt also states she was drinking water up until 0600. Dr. Bertell Maria at bedside and made aware

## 2019-06-04 NOTE — Op Note (Addendum)
DATE: 06/04/2019   PRE-OP DIAGNOSIS:  1. Right genu varum 2. Right medial meniscus root tear 3. Right Medial compartment degenerative changes   POST-OP DIAGNOSIS:  1. Right genu varum 2. Right medial meniscus root tear 3. Right Medial compartment degenerative changes  PROCEDURES:  1. Right knee high tibial osteotomy  2. Right medial meniscus root repair  2. Right medial compartment chondroplasty 4. Right knee arthroscopic removal of loose bodies    SURGEON:  Langston Reusing, MD   ASSISTANT(S):  Reche Dixon, [place]   ANESTHESIA: General   TOTAL IV FLUIDS: See anesthesia record   ESTIMATED BLOOD LOSS: 150cc   TOURNIQUET TIME:  135 min   DRAINS:  None.   SPECIMENS: None   IMPLANTS:  - Arthrex Biocomposite SwiveLock 5.76m (x1) - Arthrex iBalance HTO Implant, Medium 8 degree - Arthrex iBalance screws (two 6.5461mcancellous and two 4.61m47mortical) - Arthrex Osferion bone void filler x 2    COMPLICATIONS: None   INDICATIONS: Elizabeth Terry a [age]o. female with L knee pain approximately 2.5 months ago after she had an injury at work.  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  Immediately after this, she had severe pain and difficulty with knee range of motion after that time.  Eventual MRI showed a medial meniscus root tear with some mild medial compartment degenerative changes.  Standing alignment films demonstrated significant varus malalignment.  Given the poor long-term prognosis of a meniscus root tear and high likelihood of significant progression of osteoarthritis, we elected to proceed with the above procedure after a discussion of the risks, benefits, and alternatives to surgery.    OPERATIVE FINDINGS:    Examination under anesthesia: A careful examination under anesthesia was performed.  Passive range of motion was:  Hyperextension: 2.  Extension: 0.  Flexion: 125.  Lachman: normal. Pivot Shift: normal.  Posterior drawer: normal.  Varus stability in full extension: normal.  Varus stability in 30 degrees of flexion: normal.  Valgus stability in full extension: normal.  Valgus stability in 30 degrees of flexion: normal.   Intra-operative findings: A thorough arthroscopic examination of the knee was performed.  The findings are: 1. Suprapatellar pouch: Normal 2. Undersurface of median ridge: Grade 1 softening 3. Medial patellar facet: Normal 4. Lateral patellar facet: Grade 1 softening 5. Trochlea: Central area of grade 2-3 degenerative changes 6. Lateral gutter/popliteus tendon: Normal 7. Hoffa's fat pad: Inflamed 8. Medial gutter/plica: Normal 9. ACL: Normal 10. PCL: Normal 11. Medial meniscus: Complete tear of the medial meniscus at the posterior root; multiple loose bodies, largest measuring approximately 5 mm, present inferior to the posterior horn and body of the medial meniscus 12. Medial compartment cartilage: Significant areas of grade 2-3 degenerative changes on the medial femoral condyle; diffuse grade 2 degenerative changes the tibial plateau 13. Lateral meniscus: Normal 14. Lateral compartment cartilage: Normal   DESCRIPTION OF PROCEDURE: I identified Elizabeth Terry the pre-operative holding area.  I marked the operative knee with my initials. I reviewed the risks and benefits of the proposed surgical intervention and the patient (and/or patient's guardian) wished to proceed. The patient was transferred to the operative suite and placed in the supine position with all bony prominences padded.  Anesthesia was administered. Appropriate IV antibiotics were administered prior to incision. The extremity was then prepped and  draped in standard fashion. A time out was performed confirming the correct extremity, correct patient, and correct procedure.   Arthroscopy portals were marked. The  anterolateral portal was established with an 11 blade. The arthroscope was placed in the anterolateral portal and then into the suprapatellar pouch.  A diagnostic knee scope was completed with the above findings. Next the medial portal was established under needle localization.   The MCL was pie-crusted to improve visualization of the posterior horn.  An oscillating shaver and grasper were used to remove the loose bodies present inferior to the medial meniscus.  The meniscus root tear was identified and probed to confirm our findings. Next, using a meniscal scorpion, 2 luggage tag stitches were passed through the meniscus adjacent to the meniscus root tear.  Next, I turned my attention to performing the high tibial osteotomy. An approximately 12 cm longitudinal incision was utilized with the superior aspect of this incorporating the anteromedial portal.  Dissection was carried bluntly down to the sartorius fascia.  The pes tendons were identified.  The patellar tendon was identified as well.  First, the medial aspect of the patellar tendon was incised and a retractor was placed underneath the patellar tendon to protect it through the remainder of the case.  The medial aspect of the patellar tendon insertion medially was slightly elevated to expose the anterior cortex of the tibia in the region of the planned osteotomy.  Next, an L-shaped flap of the sartorius fascia and MCL was elevated subperiosteally using combination of Bovie electrocautery and Cobb elevators.  The posterior elevator was then utilized to elevate any tissue off of the posterior cortex of the tibia.   Then, using fluoroscopy and the knee slightly flexed, an appropriate AP view was obtained such that the lateral tibial plateau was in profile.  Rotation was controlled such that there was 50% overlap of the fibular head with the lateral tibial plateau.  A small stab incision was made at the region of Gerdy's tubercle after localizing appropriate  position of the planned hinged pin with fluoroscopy.  A 2.4 mm guidepin was placed such that there was 25% more distance between the hinge pin and the superior aspect of the lateral tibial plateau compared to the lateral tibia.  It was placed perpendicular to the posterior cortex of the tibia on the lateral view to maintain appropriate slope.  This pin was then overreamed with a 5 mm cannulated reamer.  The appropriate hinge pin was placed into the hole.  The Arthrex cutting guide was secured via 2 breakaway pins with the reference off of the hinge pin.  Fluoroscopy was used to confirm appropriate cut trajectory.  Utilizing the cutting guide and using appropriate retractors for protection of both the patellar tendon and posterior neurovascular structures,, a saw was used to start the osteotomy, ensuring both the anterior and posterior cortices were cut.  Next, a flexible osteotome was used to advance the cut both anteriorly and posteriorly until the hinge pin was met.  Next, another osteotome was advanced to the hinge pin such that they were stacked.  Care was taken to make sure the anterior and posterior cortices were completely cut.  Then, the guide system to place the lug holes for the implant was placed and holes were drilled appropriately.  Reaming from these holes was saved for bone grafting.  Next, an opening jack was utilized to perform the opening wedge osteotomy until desired correction had been achieved.  Amount of correction and size of  implant was based on preoperative templating.  The jack was opened an additional 2-3 degrees to allow for appropriate implant placement.  Next, Osferion bone wedges were placed both anteriorly and posteriorly after cutting down to an appropriate size.  Next, the appropriate sized implant was placed with the jack open and a tamp was used to ensure that it sat flush with the tibial cortex.  The jack was disassembled and removed while maintaining the implant in place.   Then, cortical and cancellous screws (2 each) were placed with appropriate drilling and tapping as needed.  Fluoroscopy was used to confirm appropriate position of the implant and screws.  This nicely secured the implant.  Prior bone reamings that had been obtained were placed anteriorly.  This appropriately completed the high tibial osteotomy portion of the procedure.  I then placed the arthroscope back into the joint through the anterolateral portal.  An Arthrex aiming guide was used through the anteromedial portal, and placed on the anatomic footprint of the medial meniscus root.  The arm was swung laterally such that the tunnel could be made through the previously made hinge pin incision. This incision was extended slightly to allow for adequate visualization. We then used a FlipCutter to drill into the tibia and create a 41m socket for the meniscus root. A FiberStick was passed through our tibial tunnel and into the joint. This was retrieved and passed through the anterolateral portal. We ensured there was no soft tissue bridge between the sutures and pulled the passing stitch from the FNowthenthrough the anteromedial portal. We then used the passing stitch to bring the sutures in the meniscus out through the tibial tunnel. These sutures were then passed through an Arthrex SwiveLock anchor. Anchor hole was drilled ~2cm distal to the previously drilled tibial tunnel. Anchor was inserted with appropriate tension while visualizing the repair with the arthroscope, and this achieved excellent interference fit. The meniscus root was probed and found to be stable.  Lastly, a chondroplasty of the medial compartment was performed using an oscillating shaver such that there were stable cartilage edges of the medial femoral condyle and medial tibial plateau.  Any loose bony debris was removed from the knee joint with a shaver and excess fluid was evacuated from the joint. Closure of the anterolateral portal with  3-0 Nylon was performed.  Anterolateral stab incisions for hinge pin and tibial tunnel for root repair were closed with 2-0 Vicryl and 3-0 nylon sutures.  The sartorius fascia was closed 0 vicryl. The subdermal layer of the anteromedial HTO incision was closed with 2-0 vicryl and the skin was closed with 4-0 Monocryl in a running fashion and Dermabond.  Xeroform gauze and dry sterile dressings were applied. A PolarCare and hinged knee brace were also applied.   Instrument, sponge, and needle counts were correct prior to wound closure and at the conclusion of the case.   Of note, assistance from a PA was essential to performing the surgery.  PA was present for the entire surgery.  PA assisted with patient positioning, retraction, instrumentation, and wound closure. The surgery would have been more difficult and had longer operative time without PA assistance.   Additionally, this case had increased complexity compared to standard meniscus repair given that the patient had a complete tear of the meniscus root. Repair of this tear involved extending previous incision, drilling a tunnel through the tibia and using an implant in the tibia for fixation, all of which would otherwise not occur for standard  meniscal repairs.  The steps increased surgical time by approximately 30 minutes compared to standard meniscus repair.    POSTOPERATIVE PLAN: The patient will be admitted overnight for pain control and PT/OT.  Non-weight bearing x 4 weeks. 50% WB from weeks 4-6. ASA EC for DVT ppx x 4 weeks, The patient will be attending physical therapy beginning 3-4 days post-op. Physical therapy per Meniscus Root Repair and High Tibial Osteotomy Rehab Guidelines.   Patient to return to clinic 10-14 days postop for suture removal.

## 2019-06-04 NOTE — Anesthesia Preprocedure Evaluation (Addendum)
Anesthesia Evaluation  Patient identified by MRN, date of birth, ID band Patient awake    Reviewed: Allergy & Precautions, NPO status , Patient's Chart, lab work & pertinent test results  History of Anesthesia Complications Negative for: history of anesthetic complications  Airway Mallampati: II  TM Distance: >3 FB Neck ROM: Full    Dental no notable dental hx. (+) Teeth Intact, Dental Advisory Given   Pulmonary neg pulmonary ROS, neg sleep apnea, neg COPD, Patient abstained from smoking.Not current smoker,    Pulmonary exam normal breath sounds clear to auscultation       Cardiovascular Exercise Tolerance: Good METShypertension, (-) CAD and (-) Past MI (-) dysrhythmias  Rhythm:Regular Rate:Normal - Systolic murmurs    Neuro/Psych negative neurological ROS  negative psych ROS   GI/Hepatic GERD  Poorly Controlled and Medicated,(+)     (-) substance abuse  ,   Endo/Other  neg diabetes  Renal/GU negative Renal ROS     Musculoskeletal  (+) Arthritis ,   Abdominal   Peds  Hematology   Anesthesia Other Findings Past Medical History: No date: Anemia     Comment:  vitamin d deficiency No date: Arthritis No date: Ectopic pregnancy     Comment:  tube removed No date: GERD (gastroesophageal reflux disease) No date: Hyperlipidemia No date: Hypertension No date: Vertigo No date: Vitamin D deficiency  Reproductive/Obstetrics                             Anesthesia Physical Anesthesia Plan  ASA: II  Anesthesia Plan: General   Post-op Pain Management:  Regional for Post-op pain   Induction: Intravenous  PONV Risk Score and Plan: 4 or greater and Ondansetron, Dexamethasone and Midazolam  Airway Management Planned: Oral ETT  Additional Equipment: None  Intra-op Plan:   Post-operative Plan: Extubation in OR  Informed Consent: I have reviewed the patients History and Physical, chart,  labs and discussed the procedure including the risks, benefits and alternatives for the proposed anesthesia with the patient or authorized representative who has indicated his/her understanding and acceptance.     Dental advisory given  Plan Discussed with: CRNA and Surgeon  Anesthesia Plan Comments: (Discussed with patient neuraxial vs GA, and how I think GA is safer given patient's GERD and reflux symptoms this morning necessitating her to drink water (6am water). Discussed risks of anesthesia with patient, including PONV, sore throat, lip/dental damage. Rare risks discussed as well, such as cardiorespiratory and neurological sequelae and aspiration. Patient understands.  Case slightly delayed due to NPO status.  Addendum 0745: consented patient preoperatively to possible post-op right adductor canal nerve block. Discussed r/b/a of adductor canal nerve block, including:  - bleeding, infection, nerve damage - poor or non functioning block. Patient understands. )       Anesthesia Quick Evaluation

## 2019-06-04 NOTE — Anesthesia Procedure Notes (Signed)
Procedure Name: Intubation Date/Time: 06/04/2019 8:02 AM Performed by: Allean Found, CRNA Pre-anesthesia Checklist: Patient identified, Patient being monitored, Timeout performed, Emergency Drugs available and Suction available Patient Re-evaluated:Patient Re-evaluated prior to induction Oxygen Delivery Method: Circle system utilized Preoxygenation: Pre-oxygenation with 100% oxygen Induction Type: IV induction and Rapid sequence Ventilation: Mask ventilation without difficulty Laryngoscope Size: 3 and McGraph Grade View: Grade I Tube type: Oral Tube size: 7.0 mm Number of attempts: 1 Airway Equipment and Method: Stylet Placement Confirmation: ETT inserted through vocal cords under direct vision,  positive ETCO2 and breath sounds checked- equal and bilateral Secured at: 21 cm Tube secured with: Tape Dental Injury: Teeth and Oropharynx as per pre-operative assessment

## 2019-06-04 NOTE — Progress Notes (Signed)
Patient still c/o feeling like she needs to stretch her leg and itching , Given Prn hydroxyzine , and Robaxin. If ineffective will notify Dr. Posey Pronto

## 2019-06-04 NOTE — Evaluation (Signed)
Physical Therapy Evaluation Patient Details Name: Elizabeth Terry MRN: [id number] DOB: Jun 28, 1970 Today's Date: 06/04/2019   History of Present Illness  Per MD notes: Pt is a 49 y/o F s/p R knee surgery, following a work place injury, that included high tibial osteotomy, medial meniscus root repair, medial compartment chondroplasty, and arthroscopic removal of loose bodies. PMH includes GERD, HLD, HTN, and vertigo.    Clinical Impression  Pt pleasant and motivated to participate during the session but ultimately was limited to therex and bed mobility training secondary to N&V, nursing notified.  Pt put forth good effort with bed mobility tasks only requiring min A for RLE control but requested to defer transfer training until next session and to return to supine after vomiting while sitting at the EOB.  Pt is expected to make good progress while in acute care once N&V are no longer limiting the pt's ability to participate and pt has good family support at home.  Pt will benefit from continued PT services upon discharge per surgeon's recommendations to safely address deficits listed in patient problem list for decreased caregiver assistance and eventual return to PLOF.         Follow Up Recommendations Follow surgeon's recommendation for DC plan and follow-up therapies(Per chart review OPPT set up for POD#3)    Equipment Recommendations  Rolling walker with 5" wheels;3in1 (PT)    Recommendations for Other Services       Precautions / Restrictions Precautions Precautions: Fall Required Braces or Orthoses: Knee Immobilizer - Right Knee Immobilizer - Right: On at all times Restrictions Weight Bearing Restrictions: Yes RLE Weight Bearing: Non weight bearing Other Position/Activity Restrictions: R KI locked in extension at all times; per surgeon Dr. Posey Pronto ok for RLE QS and hip therex in KI      Mobility  Bed Mobility Overal bed mobility: Needs Assistance Bed Mobility: Supine to Sit;Sit  to Supine     Supine to sit: Supervision Sit to supine: Min assist   General bed mobility comments: Extra time and effort with sup to sit and min A for RLE control during sit to sup  Transfers                 General transfer comment: NT secondary to pt with nausea and vomiting while sitting at EOB, requested to defer transfer attempt  Ambulation/Gait             General Gait Details: NT per above  Stairs            Wheelchair Mobility    Modified Rankin (Stroke Patients Only)       Balance Overall balance assessment: No apparent balance deficits (not formally assessed)                                           Pertinent Vitals/Pain Pain Assessment: 0-10 Pain Score: 5  Pain Location: R knee Pain Descriptors / Indicators: Aching;Sore Pain Intervention(s): Premedicated before session;Monitored during session;Patient requesting pain meds-RN notified    Home Living Family/patient expects to be discharged to:: Private residence Living Arrangements: Spouse/significant other;Children;Parent Available Help at Discharge: Family;Available 24 hours/day Type of Home: House       Home Layout: Two level;Able to live on main level with bedroom/bathroom Home Equipment: Crutches      Prior Function Level of Independence: Independent         Comments:  Pt Ind with amb community distances without an AD, works FT as a Quarry manager, Ind with ADLs     Hand Dominance        Extremity/Trunk Assessment   Upper Extremity Assessment Upper Extremity Assessment: Overall WFL for tasks assessed    Lower Extremity Assessment Lower Extremity Assessment: Generalized weakness;RLE deficits/detail RLE Deficits / Details: BLE ankle DF/PF strength and AROM WNL RLE: Unable to fully assess due to pain;Unable to fully assess due to immobilization RLE Sensation: WNL       Communication   Communication: No difficulties  Cognition Arousal/Alertness:  Awake/alert Behavior During Therapy: WFL for tasks assessed/performed Overall Cognitive Status: Within Functional Limits for tasks assessed                                        General Comments      Exercises Total Joint Exercises Ankle Circles/Pumps: AROM;Strengthening;Both;5 reps;10 reps Quad Sets: Strengthening;Both;10 reps Heel Slides: AROM;Left;5 reps Hip ABduction/ADduction: AROM;Strengthening;AAROM;Both;10 reps(AAROM on the RLE) Straight Leg Raises: AROM;AAROM;Strengthening;Both;10 reps(AAROM on the RLE) Long Arc Quad: Strengthening;AROM;Left;10 reps Knee Flexion: AROM;Strengthening;Left;10 reps   Assessment/Plan    PT Assessment Patient needs continued PT services  PT Problem List Decreased strength;Decreased range of motion;Decreased activity tolerance;Decreased balance;Decreased mobility;Decreased knowledge of use of DME;Pain;Decreased knowledge of precautions       PT Treatment Interventions DME instruction;Gait training;Stair training;Functional mobility training;Therapeutic activities;Therapeutic exercise;Balance training;Patient/family education    PT Goals (Current goals can be found in the Care Plan section)  Acute Rehab PT Goals Patient Stated Goal: To get up and do the things I need to do PT Goal Formulation: With patient Time For Goal Achievement: 06/17/19 Potential to Achieve Goals: Good    Frequency BID   Barriers to discharge        Co-evaluation               AM-PAC PT "6 Clicks" Mobility  Outcome Measure Help needed turning from your back to your side while in a flat bed without using bedrails?: A Little Help needed moving from lying on your back to sitting on the side of a flat bed without using bedrails?: A Little Help needed moving to and from a bed to a chair (including a wheelchair)?: A Lot Help needed standing up from a chair using your arms (e.g., wheelchair or bedside chair)?: A Lot Help needed to walk in  hospital room?: A Lot Help needed climbing 3-5 steps with a railing? : A Lot 6 Click Score: 14    End of Session Equipment Utilized During Treatment: Gait belt Activity Tolerance: Other (comment)(Pt limited by N&V during the session) Patient left: in bed;Other (comment);with family/visitor present;with call bell/phone within reach;with bed alarm set(Polar care to R knee) Nurse Communication: Mobility status PT Visit Diagnosis: Other abnormalities of gait and mobility (R26.89);Muscle weakness (generalized) (M62.81);Pain Pain - Right/Left: Right Pain - part of body: Knee    Time: [id number] PT Time Calculation (min) (ACUTE ONLY): 56 min   Charges:   PT Evaluation $PT Eval Moderate Complexity: 1 Mod PT Treatments $Therapeutic Exercise: 8-22 mins        D. Royetta Asal PT, DPT 06/04/19, 5:11 PM

## 2019-06-05 DIAGNOSIS — S83241A Other tear of medial meniscus, current injury, right knee, initial encounter: Secondary | ICD-10-CM | POA: Diagnosis not present

## 2019-06-05 LAB — CBC
HCT: 30 % — ABNORMAL LOW (ref 36.0–46.0)
Hemoglobin: 9.5 g/dL — ABNORMAL LOW (ref 12.0–15.0)
MCH: 25.3 pg — ABNORMAL LOW (ref 26.0–34.0)
MCHC: 31.7 g/dL (ref 30.0–36.0)
MCV: 79.8 fL — ABNORMAL LOW (ref 80.0–100.0)
Platelets: 409 10*3/uL — ABNORMAL HIGH (ref 150–400)
RBC: 3.76 MIL/uL — ABNORMAL LOW (ref 3.87–5.11)
RDW: 15.4 % (ref 11.5–15.5)
WBC: 10.4 10*3/uL (ref 4.0–10.5)
nRBC: 0 % (ref 0.0–0.2)

## 2019-06-05 LAB — BASIC METABOLIC PANEL
Anion gap: 12 (ref 5–15)
BUN: 9 mg/dL (ref 6–20)
CO2: 25 mmol/L (ref 22–32)
Calcium: 8.9 mg/dL (ref 8.9–10.3)
Chloride: 100 mmol/L (ref 98–111)
Creatinine, Ser: 0.9 mg/dL (ref 0.44–1.00)
GFR calc Af Amer: >60 mL/min (ref >60)
GFR calc non Af Amer: >60 mL/min (ref >60)
Glucose, Bld: 113 mg/dL — ABNORMAL HIGH (ref 70–99)
Potassium: 3.4 mmol/L — ABNORMAL LOW (ref 3.5–5.1)
Sodium: 137 mmol/L (ref 135–145)

## 2019-06-05 MED ORDER — TRAMADOL HCL 50 MG PO TABS: 50.0000 mg | ORAL_TABLET | Freq: Four times a day (QID) | ORAL | 0 refills | Status: DC | PRN

## 2019-06-05 MED ORDER — ASPIRIN 325 MG PO TBEC: 325.0000 mg | DELAYED_RELEASE_TABLET | Freq: Every day | ORAL | 0 refills | Status: DC

## 2019-06-05 MED ORDER — METHOCARBAMOL 500 MG PO TABS: 500.0000 mg | ORAL_TABLET | Freq: Four times a day (QID) | ORAL | 0 refills | Status: DC | PRN

## 2019-06-05 MED ORDER — OXYCODONE HCL 5 MG PO TABS: 2.5000 mg | ORAL_TABLET | ORAL | 0 refills | Status: DC | PRN

## 2019-06-05 MED ORDER — ONDANSETRON HCL 4 MG PO TABS: 4.0000 mg | ORAL_TABLET | Freq: Four times a day (QID) | ORAL | 0 refills | Status: DC | PRN

## 2019-06-05 NOTE — Discharge Summary (Signed)
Physician Discharge Summary  Subjective: 1 Day Post-Op Procedure(s) (LRB): RIGHT KNEE ARTHROSCOPY, MEDIAL MENISCUS ROOT REPAIR, CHONDROPLASTY, AND OPEN HIGH TIBIAL OSTEOTOMY (Right) Patient reports pain as moderate.   Patient seen in rounds with Dr. Posey Pronto. Patient is well, and has had no acute complaints or problems Patient is ready to go home after physical therapy  Physician Discharge Summary  Patient ID: Elizabeth Terry MRN: [id number] DOB/AGE: [birth year] 49 y.o.  Admit date: 06/04/2019 Discharge date: 06/05/2019  Admission Diagnoses:  Discharge Diagnoses:  Active Problems:   Genu varum   Discharged Condition: fair  Hospital Course: The patient is postop day 1 from a right knee arthroscopy with medial meniscus root repair, chondroplasty, and open high tibial osteotomy.  She is doing fine after surgery yesterday.  She has had slowly improved pain control.  She has not been able to ambulate yet with physical therapy.  She is to work with physical therapy today.  She is scheduled to go home after physical therapy.  Her vitals have remained stable.  Treatments: surgery:  1. Right knee high tibial osteotomy  2. Right medial meniscus root repair  2. Right medial compartment chondroplasty 4. Right knee arthroscopic removal of loose bodies   SURGEON:  Langston Reusing, MD  ASSISTANT(S): Reche Dixon, [place]  ANESTHESIA:General  TOTAL IV FLUIDS: See anesthesia record  ESTIMATED BLOOD LOSS:150cc  TOURNIQUET TIME: 135 min  DRAINS: None.  SPECIMENS:None  IMPLANTS:  - Arthrex Biocomposite SwiveLock 5.68mm (x1) - Arthrex iBalance HTO Implant, Medium 8 degree - Arthrex iBalance screws (two 6.20mm cancellous and two 4.13mm cortical) - Arthrex Osferion bone void filler x 2   COMPLICATIONS: None  Discharge Exam: Blood pressure 127/86, pulse (!) 103, temperature 98 F (36.7 C), temperature source Oral, resp. rate 16, height 5\' 6"  (1.676 m), weight 96.2 kg, SpO2  97 %.   Disposition: Discharge disposition: 01-Home or Self Care        Allergies as of 06/05/2019      Reactions   Grapefruit Extract Swelling   HIGH AMOUNTS OF CITRIC JUICES WILL CAUSE THIS   Aspirin Nausea And Vomiting   ALL ASPIRIN PRODUCTS      Medication List    TAKE these medications   aspirin 325 MG EC tablet Take 1 tablet (325 mg total) by mouth daily.   cholecalciferol 25 MCG (1000 UNIT) tablet Commonly known as: VITAMIN D3 Take 1,000 Units by mouth daily.   hydrochlorothiazide 12.5 MG tablet Commonly known as: HYDRODIURIL Take 1 tablet (12.5 mg total) by mouth daily.   meclizine 25 MG tablet Commonly known as: ANTIVERT meclizine 25 mg tablet  Take 1 tablet(s) 3 times a day by oral route with meals.   methocarbamol 500 MG tablet Commonly known as: ROBAXIN Take 1 tablet (500 mg total) by mouth every 6 (six) hours as needed for muscle spasms.   multivitamin with minerals tablet Take 1 tablet by mouth daily.   Omeprazole 20 MG Tbec omeprazole 20 mg capsule,delayed release   ondansetron 4 MG tablet Commonly known as: ZOFRAN Take 1 tablet (4 mg total) by mouth every 6 (six) hours as needed for nausea.   oxyCODONE 5 MG immediate release tablet Commonly known as: Oxy IR/ROXICODONE Take 0.5-1 tablets (2.5-5 mg total) by mouth every 4 (four) hours as needed for moderate pain or severe pain (pain score 4-6).   traMADol 50 MG tablet Commonly known as: ULTRAM Take 1 tablet (50 mg total) by mouth every 6 (six) hours as needed for moderate pain.  Vitamin D (Ergocalciferol) 1.25 MG (50000 UNIT) Caps capsule Commonly known as: DRISDOL Take 50,000 Units by mouth every 7 (seven) days. Has not had any of these to take for a while now.      Follow-up Information    Leim Fabry, MD Follow up in 2 week(s).   Specialty: Orthopedic Surgery Contact information: [place] [phone]           Signed: Prescott Parma,  Murel Wigle 06/05/2019, 7:20 AM   Objective: Vital signs in last 24 hours: Temp:  [97.2 F (36.2 C)-98 F (36.7 C)] 98 F (36.7 C) (03/29 2239) Pulse Rate:  [87-109] 103 (03/29 2239) Resp:  [11-18] 16 (03/29 2239) BP: (118-138)/(69-92) 127/86 (03/29 2239) SpO2:  [94 %-100 %] 97 % (03/29 2239) Weight:  [96.2 kg] 96.2 kg (03/29 2058)  Intake/Output from previous day:  Intake/Output Summary (Last 24 hours) at 06/05/2019 0720 Last data filed at 06/05/2019 0500 Gross per 24 hour  Intake 2000 ml  Output 2390 ml  Net -390 ml    Intake/Output this shift: No intake/output data recorded.  Labs: Recent Labs    06/05/19 0518  HGB 9.5*   Recent Labs    06/05/19 0518  WBC 10.4  RBC 3.76*  HCT 30.0*  PLT 409*   Recent Labs    06/05/19 0518  NA 137  K 3.4*  CL 100  CO2 25  BUN 9  CREATININE 0.90  GLUCOSE 113*  CALCIUM 8.9   No results for input(s): LABPT, INR in the last 72 hours.  EXAM: General - Patient is Alert and Oriented Extremity - Neurovascular intact Sensation intact distally Dorsiflexion/Plantar flexion intact Compartment soft Incision - clean, dry, no drainage with the brace intact. Motor Function -plantarflexion and dorsiflexion of the ankle.  Assessment/Plan: 1 Day Post-Op Procedure(s) (LRB): RIGHT KNEE ARTHROSCOPY, MEDIAL MENISCUS ROOT REPAIR, CHONDROPLASTY, AND OPEN HIGH TIBIAL OSTEOTOMY (Right) Procedure(s) (LRB): RIGHT KNEE ARTHROSCOPY, MEDIAL MENISCUS ROOT REPAIR, CHONDROPLASTY, AND OPEN HIGH TIBIAL OSTEOTOMY (Right) Past Medical History:  Diagnosis Date  . Anemia    vitamin d deficiency  . Arthritis   . Ectopic pregnancy    tube removed  . GERD (gastroesophageal reflux disease)   . Hyperlipidemia   . Hypertension   . Vertigo   . Vitamin D deficiency    Active Problems:   Genu varum  Estimated body mass index is 34.22 kg/m as calculated from the following:   Height as of this encounter: 5\' 6"  (1.676 m).   Weight as of this encounter:  96.2 kg. Advance diet Up with therapy D/C IV fluids Diet - Regular diet Follow up - in 2 weeks Activity - NWB Disposition - Home Condition Upon Discharge - Stable DVT Prophylaxis - Aspirin  Reche Dixon, PA-C Orthopaedic Surgery 06/05/2019, 7:20 AM

## 2019-06-05 NOTE — Progress Notes (Signed)
Physical Therapy Treatment Patient Details Name: Elizabeth Terry MRN: [id number] DOB: 1970-03-27 Today's Date: 06/05/2019    History of Present Illness Per MD notes: Pt is a 49 y/o F s/p R knee surgery, following a work place injury, that included high tibial osteotomy, medial meniscus root repair, medial compartment chondroplasty, and arthroscopic removal of loose bodies. PMH includes GERD, HLD, HTN, and vertigo.    PT Comments    Pt pleasant and motivated to participate during the session.  Pt put forth very good effort throughout the session and never required physical assist with functional mobility tasks.  Pt required multi-modal cues for proper sequencing with transfers and gait for safety and WB compliance but showed good carryover during the session.  Pt ambulated with hop-to gait pattern 15 feet x 2 with seated rest break between sessions with SpO2 and HR WNL and without any adverse symptoms other than RLE pain.  Will perform stair training during PM session.  Pt will benefit from PT services per surgeon's recommendation upon discharge to safely address deficits listed in patient problem list for decreased caregiver assistance and eventual return to PLOF.      Follow Up Recommendations  Follow surgeon's recommendation for DC plan and follow-up therapies     Equipment Recommendations  Rolling walker with 5" wheels;3in1 (PT)    Recommendations for Other Services       Precautions / Restrictions Precautions Precautions: Fall Required Braces or Orthoses: Knee Immobilizer - Right Knee Immobilizer - Right: On at all times Restrictions Weight Bearing Restrictions: Yes RLE Weight Bearing: Non weight bearing Other Position/Activity Restrictions: R KI locked in extension at all times; per surgeon Dr. Posey Pronto ok for RLE QS and hip therex in KI    Mobility  Bed Mobility Overal bed mobility: Needs Assistance Bed Mobility: Supine to Sit     Supine to sit: Supervision     General  bed mobility comments: Extra time and effort with sup to sit with pt using BUEs to assist RLE out of bed  Transfers Overall transfer level: Needs assistance Equipment used: Rolling walker (2 wheeled) Transfers: Sit to/from Stand Sit to Stand: Min guard         General transfer comment: Mod verbal and visual cues for sequencing with transfer training practiced from 3 different surfaces  Ambulation/Gait Ambulation/Gait assistance: Min guard Gait Distance (Feet): 15 Feet x 2, 5 Feet x 2 Assistive device: Rolling walker (2 wheeled)   Gait velocity: decreased   General Gait Details: Hop-to gait with CGA and mod verbal and visual cues for sequencing with practice ambulating forward, backward, and 180 deg turns   Stairs             Wheelchair Mobility    Modified Rankin (Stroke Patients Only)       Balance Overall balance assessment: Needs assistance   Sitting balance-Leahy Scale: Normal     Standing balance support: Bilateral upper extremity supported Standing balance-Leahy Scale: Good                              Cognition Arousal/Alertness: Awake/alert Behavior During Therapy: WFL for tasks assessed/performed Overall Cognitive Status: Within Functional Limits for tasks assessed                                        Exercises Total Joint Exercises Ankle Circles/Pumps:  AROM;Strengthening;Both;5 reps;10 reps Quad Sets: Strengthening;Both;10 reps;15 reps Heel Slides: AROM;Left;5 reps Hip ABduction/ADduction: AROM;Strengthening;AAROM;Both;10 reps;15 reps(AAROM on the RLE) Straight Leg Raises: AROM;AAROM;Strengthening;Both;10 reps;15 reps(AAROM on the RLE) Long Arc Quad: Strengthening;AROM;Left;10 reps Knee Flexion: AROM;Strengthening;Left;10 reps Other Exercises: Verbal education on sequencing for ascending and descending stairs with several strategies Other Exercises: Car transfer sequencing education provided verbally and with  simulated visual demonstration Other Exercises: HEP education and review for BLE APs, QS, and GS x 10 each every 1-2 hours daily    General Comments        Pertinent Vitals/Pain Pain Assessment: 0-10 Pain Score: 5  Pain Location: R knee Pain Descriptors / Indicators: Aching;Sore Pain Intervention(s): Premedicated before session;Monitored during session    Home Living                      Prior Function            PT Goals (current goals can now be found in the care plan section) Progress towards PT goals: Progressing toward goals    Frequency    BID      PT Plan Current plan remains appropriate    Co-evaluation              AM-PAC PT "6 Clicks" Mobility   Outcome Measure  Help needed turning from your back to your side while in a flat bed without using bedrails?: A Little Help needed moving from lying on your back to sitting on the side of a flat bed without using bedrails?: A Little Help needed moving to and from a bed to a chair (including a wheelchair)?: A Little Help needed standing up from a chair using your arms (e.g., wheelchair or bedside chair)?: A Little Help needed to walk in hospital room?: A Little Help needed climbing 3-5 steps with a railing? : A Lot 6 Click Score: 17    End of Session Equipment Utilized During Treatment: Gait belt Activity Tolerance: Patient tolerated treatment well Patient left: in chair;with call bell/phone within reach;with chair alarm set;with SCD's reapplied;Other (comment)(polar care donned to R knee) Nurse Communication: Mobility status PT Visit Diagnosis: Other abnormalities of gait and mobility (R26.89);Muscle weakness (generalized) (M62.81);Pain Pain - Right/Left: Right Pain - part of body: Knee     Time: [id number] PT Time Calculation (min) (ACUTE ONLY): 58 min  Charges:  $Gait Training: 23-37 mins $Therapeutic Exercise: 8-22 mins $Therapeutic Activity: 8-22 mins                     D. Scott  Remy Voiles PT, DPT 06/05/19, 12:12 PM

## 2019-06-05 NOTE — Progress Notes (Signed)
  Subjective: 1 Day Post-Op Procedure(s) (LRB): RIGHT KNEE ARTHROSCOPY, MEDIAL MENISCUS ROOT REPAIR, CHONDROPLASTY, AND OPEN HIGH TIBIAL OSTEOTOMY (Right) Patient reports pain as moderate.   Patient is well, and has had no acute complaints or problems Plan is to go Home after hospital stay. Negative for chest pain and shortness of breath Fever: no Gastrointestinal: Negative for nausea and vomiting  Objective: Vital signs in last 24 hours: Temp:  [97.2 F (36.2 C)-98 F (36.7 C)] 98 F (36.7 C) (03/29 2239) Pulse Rate:  [87-109] 103 (03/29 2239) Resp:  [11-18] 16 (03/29 2239) BP: (118-138)/(69-92) 127/86 (03/29 2239) SpO2:  [94 %-100 %] 97 % (03/29 2239) Weight:  [96.2 kg] 96.2 kg (03/29 2058)  Intake/Output from previous day:  Intake/Output Summary (Last 24 hours) at 06/05/2019 [id number] Last data filed at 06/05/2019 0500 Gross per 24 hour  Intake 2000 ml  Output 2390 ml  Net -390 ml    Intake/Output this shift: No intake/output data recorded.  Labs: Recent Labs    06/05/19 0518  HGB 9.5*   Recent Labs    06/05/19 0518  WBC 10.4  RBC 3.76*  HCT 30.0*  PLT 409*   Recent Labs    06/05/19 0518  NA 137  K 3.4*  CL 100  CO2 25  BUN 9  CREATININE 0.90  GLUCOSE 113*  CALCIUM 8.9   No results for input(s): LABPT, INR in the last 72 hours.   EXAM General - Patient is Alert and Oriented Extremity - Neurovascular intact Sensation intact distally Dorsiflexion/Plantar flexion intact Compartment soft Dressing/Incision - clean, dry, no drainage with the hinged brace in place. Motor Function - intact, moving foot and toes well on exam.   Past Medical History:  Diagnosis Date  . Anemia    vitamin d deficiency  . Arthritis   . Ectopic pregnancy    tube removed  . GERD (gastroesophageal reflux disease)   . Hyperlipidemia   . Hypertension   . Vertigo   . Vitamin D deficiency     Assessment/Plan: 1 Day Post-Op Procedure(s) (LRB): RIGHT KNEE ARTHROSCOPY,  MEDIAL MENISCUS ROOT REPAIR, CHONDROPLASTY, AND OPEN HIGH TIBIAL OSTEOTOMY (Right) Active Problems:   Genu varum  Estimated body mass index is 34.22 kg/m as calculated from the following:   Height as of this encounter: 5\' 6"  (1.676 m).   Weight as of this encounter: 96.2 kg. Advance diet Up with therapy D/C IV fluids  Discharge home with outpatient physical therapy.  DVT Prophylaxis - Aspirin None Weight-Bearing  to right leg  Reche Dixon, PA-C Orthopaedic Surgery 06/05/2019, 7:12 AM

## 2019-06-05 NOTE — Progress Notes (Signed)
Physical Therapy Treatment Patient Details Name: Elizabeth Terry MRN: [id number] DOB: [birth year] Today's Date: 06/05/2019    History of Present Illness Per MD notes: Pt is a 49 y/o F s/p R knee surgery, following a work place injury, that included high tibial osteotomy, medial meniscus root repair, medial compartment chondroplasty, and arthroscopic removal of loose bodies. PMH includes GERD, HLD, HTN, and vertigo.    PT Comments    Pt pleasant and motivated to participate during the session.  Pt able to complete sup to sit without physical assist with use of her BUEs to assist her RLE out of bed.  Pt with good carryover during transfer training from various height surfaces.  Pt steady with amb and stair training with good eccentric and concentric control ascending and descending steps.  Pt will benefit from continued PT services per surgeon's recommendation upon discharge to safely address deficits listed in patient problem list for decreased caregiver assistance and eventual return to PLOF.      Follow Up Recommendations  Follow surgeon's recommendation for DC plan and follow-up therapies     Equipment Recommendations  Rolling walker with 5" wheels;3in1 (PT)    Recommendations for Other Services       Precautions / Restrictions Precautions Precautions: Fall Required Braces or Orthoses: Knee Immobilizer - Right Knee Immobilizer - Right: On at all times Restrictions Weight Bearing Restrictions: Yes RLE Weight Bearing: Non weight bearing Other Position/Activity Restrictions: R KI locked in extension at all times; per surgeon Dr. Posey Pronto ok for RLE QS and hip therex in KI    Mobility  Bed Mobility Overal bed mobility: Needs Assistance Bed Mobility: Supine to Sit     Supine to sit: Supervision     General bed mobility comments: Extra time and effort and use of BUEs for RLE control required  Transfers Overall transfer level: Needs assistance Equipment used: Rolling walker (2  wheeled) Transfers: Sit to/from Stand Sit to Stand: Min guard         General transfer comment: Min verbal cues for sequencing with transfer training practiced from 2 different surfaces  Ambulation/Gait Ambulation/Gait assistance: Min guard Gait Distance (Feet): 10 Feet Assistive device: Rolling walker (2 wheeled)   Gait velocity: decreased   General Gait Details: Hop-to gait with CGA and min verbal cues for sequencing with practice ambulating forward, backward, and 180 deg turns.  Pt steady with good carryover of proper sequencing throughout.   Stairs Stairs: Yes Stairs assistance: Min guard Stair Management: Backwards;Forwards;With walker Number of Stairs: 1 General stair comments: Ascend backwards and descend forwards 1 step x 3 with RW and CGA   Wheelchair Mobility    Modified Rankin (Stroke Patients Only)       Balance Overall balance assessment: Needs assistance Sitting-balance support: Feet supported;No upper extremity supported Sitting balance-Leahy Scale: Normal     Standing balance support: Bilateral upper extremity supported Standing balance-Leahy Scale: Good                              Cognition Arousal/Alertness: Awake/alert Behavior During Therapy: WFL for tasks assessed/performed Overall Cognitive Status: Within Functional Limits for tasks assessed                                        Exercises Other Exercises: Pt education provided on strategies for safety with amb including use of  portable chair to cut amb distances to manageable and safe increments Other Exercises: Verbal education and visual demonstration on techniques for ascending multiple stairs on bottom with pt encouraged to utilize 1st floor bedroom/bathroom to avoid 2nd floor as needed for safety.  Pt education provided on use of supportive shoe on LLE only for increased functional LLE leg length for improved compliance with RLE NWB status and impact  absorption with hop-to gait.    General Comments        Pertinent Vitals/Pain Pain Assessment: 0-10 Pain Score: 5  Pain Location: R knee Pain Descriptors / Indicators: Aching;Sore Pain Intervention(s): Premedicated before session;Monitored during session    [place] expects to be discharged to:: Private residence Living Arrangements: Spouse/significant other;Children;Parent Available Help at Discharge: Family;Available 24 hours/day Type of Home: House     Home Layout: Two level;Able to live on main level with bedroom/bathroom Home Equipment: Crutches      Prior Function Level of Independence: Independent      Comments: Pt Ind with amb community distances without an AD, works FT as a Quarry manager, Ind with ADLs   PT Goals (current goals can now be found in the care plan section) Acute Rehab PT Goals Patient Stated Goal: To get up and do the things I need to do Progress towards PT goals: Progressing toward goals    Frequency    BID      PT Plan Current plan remains appropriate    Co-evaluation              AM-PAC PT "6 Clicks" Mobility   Outcome Measure  Help needed turning from your back to your side while in a flat bed without using bedrails?: None Help needed moving from lying on your back to sitting on the side of a flat bed without using bedrails?: None Help needed moving to and from a bed to a chair (including a wheelchair)?: A Little Help needed standing up from a chair using your arms (e.g., wheelchair or bedside chair)?: A Little Help needed to walk in hospital room?: A Little Help needed climbing 3-5 steps with a railing? : A Little 6 Click Score: 20    End of Session Equipment Utilized During Treatment: Gait belt Activity Tolerance: Patient tolerated treatment well Patient left: in chair;with call bell/phone within reach;with chair alarm set;with SCD's reapplied;Other (comment)(Polar care donned to R knee) Nurse Communication:  Mobility status PT Visit Diagnosis: Other abnormalities of gait and mobility (R26.89);Muscle weakness (generalized) (M62.81);Pain Pain - Right/Left: Right Pain - part of body: Knee     Time: 1348-1430 PT Time Calculation (min) (ACUTE ONLY): 42 min  Charges:  $Gait Training: 23-37 mins $Therapeutic Exercise: 8-22 mins $Therapeutic Activity: 8-22 mins                     D. Scott Victorya Hillman PT, DPT 06/05/19, 3:01 PM

## 2019-06-05 NOTE — TOC Initial Note (Signed)
Transition of Care [facility]) - Initial/Assessment Note    Patient Details  Name: Elizabeth Terry MRN: [id number] Date of Birth: 06/17/1970  [facility] CM/SW Contact:    Shelbie Ammons, RN Phone Number: 06/05/2019, 9:47 AM  Clinical Narrative:   RNCM assessed patient at bedside, patient is from home and plan is for patient to return home and follow up with outpatient PT services. Patient delivered rolling walker and 3n1 commode to room. Patient reports that she is waiting to be seen by PT again and to be able to urinate and then she thinks she will be able to go home.                 Expected Discharge Plan: Home/Self Care Barriers to Discharge: No Barriers Identified   Patient Goals and CMS Choice     Choice offered to / list presented to : Patient  Expected Discharge Plan and Services Expected Discharge Plan: Home/Self Care   Discharge Planning Services: CM Consult, Other - See comment(outpatient PT) Post Acute Care Choice: Durable Medical Equipment Living arrangements for the past 2 months: Single Family Home Expected Discharge Date: 06/05/19               DME Arranged: Berta Minor DME Agency: [employer] Date DME Agency Contacted: 06/05/19 Time DME Agency Contacted: [phone] Representative spoke with at DME Agency: [place] Arrangements/Services Living arrangements for the past 2 months: Single Family Home Lives with:: Spouse, Minor Children   Do you feel safe going back to the place where you live?: Yes      Need for Family Participation in Patient Care: Yes (Comment) Care giver support system in place?: Yes (comment)   Criminal Activity/Legal Involvement Pertinent to Current Situation/Hospitalization: No - Comment as needed  Activities of Daily Living Home Assistive Devices/Equipment: Crutches, Eyeglasses, Contact lenses, Shower chair without back, Blood pressure cuff ADL Screening (condition at time of admission) Patient's cognitive  ability adequate to safely complete daily activities?: Yes Is the patient deaf or have difficulty hearing?: No Does the patient have difficulty seeing, even when wearing glasses/contacts?: No Does the patient have difficulty concentrating, remembering, or making decisions?: No Patient able to express need for assistance with ADLs?: Yes Does the patient have difficulty dressing or bathing?: No Independently performs ADLs?: Yes (appropriate for developmental age) Does the patient have difficulty walking or climbing stairs?: No Weakness of Legs: None Weakness of Arms/Hands: None  Permission Sought/Granted                  Emotional Assessment Appearance:: Appears stated age Attitude/Demeanor/Rapport: Engaged Affect (typically observed): Appropriate Orientation: : Oriented to Self, Oriented to Place, Oriented to  Time, Oriented to Situation Alcohol / Substance Use: Not Applicable Psych Involvement: No (comment)  Admission diagnosis:  Genu varum [M21.169] Patient Active Problem List   Diagnosis Date Noted  . Genu varum 06/04/2019  . Menorrhagia 11/01/2017  . Pain in pelvis 11/01/2017  . Polyp at cervical os 11/01/2017  . Gastroesophageal reflux disease without esophagitis 09/29/2016  . Intermittent vertigo 09/29/2016  . Left hand tendonitis 09/29/2016  . Essential hypertension 07/23/2016  . Hyperlipidemia 07/23/2016  . Vertigo 07/23/2016  . Vitamin D deficiency 07/23/2016   PCP:  Glendon Axe, MD Pharmacy:   [id number] - [place], [place] [address] [place] [address] Phone: [phone] Fax: [phone]     Social  Determinants of Health (SDOH) Interventions    Readmission Risk Interventions No flowsheet data found.

## 2019-06-05 NOTE — Evaluation (Signed)
Occupational Therapy Evaluation Patient Details Name: Elizabeth Terry MRN: [id number] DOB: 1971-01-21 Today's Date: 06/05/2019    History of Present Illness Per MD notes: Pt is a 49 y/o F s/p R knee surgery, following a work place injury, that included high tibial osteotomy, medial meniscus root repair, medial compartment chondroplasty, and arthroscopic removal of loose bodies. PMH includes GERD, HLD, HTN, and vertigo.   Clinical Impression   Ms. Dede Query" Terry was seen for OT evaluation this date, POD#1 from above surgery. Pt was active and independent in all ADLs prior to surgery. 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 She reports "that's my game plan, to get back to work if I can", and is eager to return to PLOF with less pain and improved safety and independence. Pt currently requires moderate assist for LB dressing while in seated position due to pain and limited AROM of R knee. Per MD orders pt is to maintain NWB status through her RLE for the first 4 weeks after sx with a knee immobilizer donned and locked in extension at all times. Pt instructed in polar care mgt, falls prevention strategies, home/routines modifications, & DME/AE for LB bathing and dressing tasks. Pt would benefit from skilled OT services including additional instruction in dressing techniques with or without assistive devices for dressing and bathing skills to support recall and carryover prior to discharge and ultimately to maximize safety, independence, and minimize falls risk and caregiver burden. Upon discharge follow surgeons recommendations for f/u therapy.      Follow Up Recommendations  Follow surgeon's recommendation for DC plan and follow-up therapies    Equipment Recommendations  3 in 1 bedside commode    Recommendations for Other Services       Precautions / Restrictions Precautions Precautions: Fall Required Braces or Orthoses: Knee Immobilizer - Right Knee Immobilizer - Right: On at all  times Restrictions Weight Bearing Restrictions: Yes RLE Weight Bearing: Non weight bearing Other Position/Activity Restrictions: R KI locked in extension at all times; per surgeon Dr. Posey Pronto ok for RLE QS and hip therex in KI      Mobility Bed Mobility Overal bed mobility: Needs Assistance Bed Mobility: Supine to Sit     Supine to sit: Supervision     General bed mobility comments: Deferred for pt comfort/safety. Pt endorses 9/10 pain as well as fatigue after working with PT this date.  Transfers Overall transfer level: Needs assistance Equipment used: Rolling walker (2 wheeled) Transfers: Sit to/from Stand Sit to Stand: Min guard         General transfer comment: Mod verbal and visual cues for sequencing with transfer training practiced from 3 different surfaces    Balance Overall balance assessment: Needs assistance Sitting-balance support: Feet supported;No upper extremity supported Sitting balance-Leahy Scale: Normal     Standing balance support: Bilateral upper extremity supported Standing balance-Leahy Scale: Good                             ADL either performed or assessed with clinical judgement   ADL Overall ADL's : Needs assistance/impaired                                       General ADL Comments: Pt is functionally limited by pain, decreased AROM, and NWB status through her RLE. She requires minimal assistance for LB ADL management including  bathing and dressing in a seated position. Supervision for safety for bed mobility.     Vision Baseline Vision/History: Wears glasses Wears Glasses: At all times Patient Visual Report: No change from baseline       Perception     Praxis      Pertinent Vitals/Pain Pain Assessment: 0-10 Pain Score: 9  Pain Location: R knee Pain Descriptors / Indicators: Aching;Sore Pain Intervention(s): Limited activity within patient's tolerance;Monitored during session;Premedicated before  session;Repositioned;Utilized relaxation techniques;Ice applied     Hand Dominance     Extremity/Trunk Assessment Upper Extremity Assessment Upper Extremity Assessment: Overall WFL for tasks assessed   Lower Extremity Assessment Lower Extremity Assessment: RLE deficits/detail RLE Deficits / Details: RLE NWB with KI on at all times. RLE: Unable to fully assess due to pain;Unable to fully assess due to immobilization RLE Sensation: WNL       Communication Communication Communication: No difficulties   Cognition Arousal/Alertness: Awake/alert Behavior During Therapy: WFL for tasks assessed/performed Overall Cognitive Status: Within Functional Limits for tasks assessed                                     General Comments       Exercises Total Joint Exercises Ankle Circles/Pumps: AROM;Strengthening;Both;5 reps;10 reps Quad Sets: Strengthening;Both;10 reps;15 reps Heel Slides: AROM;Left;5 reps Hip ABduction/ADduction: AROM;Strengthening;AAROM;Both;10 reps;15 reps(AAROM on the RLE) Straight Leg Raises: AROM;AAROM;Strengthening;Both;10 reps;15 reps(AAROM on the RLE) Long Arc Quad: Strengthening;AROM;Left;10 reps Knee Flexion: AROM;Strengthening;Left;10 reps Other Exercises Other Exercises: Pt educated in falls prevention strategies, safe use of AE/DME for ADL management, polar care management, and routines modifications to support safety and functional independence upon hospital DC. Handout provided to support recall and carryover of education. Other Exercises: HEP education and review for BLE APs, QS, and GS x 10 each every 1-2 hours daily   Shoulder Instructions      Home Living Family/patient expects to be discharged to:: Private residence Living Arrangements: Spouse/significant other;Children;Parent Available Help at Discharge: Family;Available 24 hours/day Type of Home: House       Home Layout: Two level;Able to live on main level with  bedroom/bathroom Alternate Level Stairs-Number of Steps: 13 Alternate Level Stairs-Rails: Left Bathroom Shower/Tub: Walk-in shower;Other (comment)(Garden tub)   Bathroom Toilet: Standard     Home Equipment: Crutches          Prior Functioning/Environment Level of Independence: Independent        Comments: Pt Ind with amb community distances without an AD, works FT as a Quarry manager, Ind with ADLs        OT Problem List: Decreased strength;Decreased coordination;Pain;Decreased range of motion;Decreased activity tolerance;Decreased safety awareness;Impaired balance (sitting and/or standing);Decreased knowledge of use of DME or AE;Decreased knowledge of precautions      OT Treatment/Interventions: Self-care/ADL training;Therapeutic exercise;Therapeutic activities;DME and/or AE instruction;Patient/family education;Balance training    OT Goals(Current goals can be found in the care plan section) Acute Rehab OT Goals Patient Stated Goal: To get up and do the things I need to do OT Goal Formulation: With patient Time For Goal Achievement: 06/19/19 Potential to Achieve Goals: Good ADL Goals Pt Will Perform Lower Body Dressing: sit to/from stand;sitting/lateral leans;with set-up;with supervision(With LRAD PRN for improved safety and functional independence.) Pt Will Transfer to Toilet: bedside commode;stand pivot transfer;with set-up;with supervision(With LRAD PRN for improved safety and functional independence.) Pt Will Perform Toileting - Clothing Manipulation and hygiene: sitting/lateral leans;sit to/from stand;with modified independence(With LRAD  PRN for improved safety and functional independence.) Pt Will Perform Tub/Shower Transfer: rolling walker;ambulating;with set-up;with supervision;3 in 1(With LRAD PRN for improved safety and functional independence.)  OT Frequency: Min 1X/week   Barriers to D/C:            Co-evaluation              AM-PAC OT "6 Clicks" Daily Activity      Outcome Measure Help from another person eating meals?: None Help from another person taking care of personal grooming?: A Little Help from another person toileting, which includes using toliet, bedpan, or urinal?: A Little Help from another person bathing (including washing, rinsing, drying)?: A Lot Help from another person to put on and taking off regular upper body clothing?: A Little Help from another person to put on and taking off regular lower body clothing?: A Lot 6 Click Score: 17   End of Session    Activity Tolerance: Patient limited by fatigue;Patient limited by pain Patient left: in bed;with call bell/phone within reach;with SCD's reapplied(Pt recieved w/o bed alarm on. Educated on use of call bell before attempting fxl mobility)  OT Visit Diagnosis: Other abnormalities of gait and mobility (R26.89);Pain Pain - Right/Left: Right Pain - part of body: Knee;Leg                Time: [id number] OT Time Calculation (min): 39 min Charges:  OT General Charges $OT Visit: 1 Visit OT Evaluation $OT Eval Moderate Complexity: 1 Mod OT Treatments $Self Care/Home Management : 23-37 mins  Shara Blazing, M.S., OTR/L Ascom: [phone] 06/05/19, 1:06 PM

## 2019-06-05 NOTE — Discharge Instructions (Signed)
Arthroscopic Knee Surgery - Meniscus Repair with Tibial Osteotomy   Post-Op Instructions   1. Bracing or crutches: Crutches will be provided at the time of discharge from the surgery center. Keep brace locked in extension at all times except as directed by physical therapy.  Nonweightbearing on the right leg for 4 weeks.  Between weeks 4 to 6 weeks you can begin partial weight-bear.   2. Ice: You may be provided with a device [facility]) that allows you to ice the affected area effectively. Otherwise you can ice manually.    3. Driving:  Plan on not driving for at least four weeks. Please note that you are advised NOT to drive while taking narcotic pain medications as you may be impaired and unsafe to drive.   4. Activity: Ankle pumps several times an hour while awake to prevent blood clots. Weight bearing: NO WEIGHT BEARING FOR 4 WEEKS. Use crutches for at least 4 weeks, if not 6 based on your surgery. Bending and straightening the knee is unlimited, but do not flex your knee past 90 degrees until cleared by your therapist. Elevate knee above heart level as much as possible for one week. Avoid standing more than 5 minutes (consecutively) for the first week. No exercise involving the knee until cleared by the surgeon or physical therapist.  Avoid long distance travel for 4 weeks.   5. Medications:  - You have been provided a prescription for narcotic pain medicine. After surgery, take 1-2 narcotic tablets every 4 hours if needed for severe pain. If it has tylenol (acetaminophen), please do not take a total of more than 3000mg /day of tylenol.  - A prescription for anti-nausea medication will be provided in case the narcotic medicine causes nausea - take 1 tablet every 6 hours only if nauseated.  - Take ibuprofen 800 mg every 8 hours with food to reduce post-operative knee swelling. DO NOT STOP IBUPROFEN POST-OP UNTIL INSTRUCTED TO DO SO at first post-op office visit (10-14 days after surgery).  -  Take enteric coated aspirin 325 mg once daily for 4 weeks to prevent blood clots.  -Take tylenol 1000 every 8 hours for pain.  May stop tylenol 3 days after surgery or when you are having minimal pain. If your narcotic has tylenol (acetaminophen), please do not take a total of more than 3000mg /day of tylenol.    If you are taking prescription medication for anxiety, depression, insomnia, muscle spasm, chronic pain, or for attention deficit disorder you are advised that you are at a higher risk of adverse effects with use of narcotics post-op, including narcotic addiction/dependence, depressed breathing, death. If you use non-prescribed substances: alcohol, marijuana, cocaine, heroin, methamphetamines, etc., you are at a higher risk of adverse effects with use of narcotics post-op, including narcotic addiction/dependence, depressed breathing, death. You are advised that taking > 50 morphine milligram equivalents (MME) of narcotic pain medication per day results in twice the risk of overdose or death. For your prescription provided: oxycodone 5 mg - taking more than 6 tablets per day. Be advised that we will prescribe narcotics short-term, for acute post-operative pain only - 1 week for minor operations such as knee arthroscopy for meniscus tear resection, and 3 weeks for major operations such as knee repair/reconstruction surgeries.   6. Bandages: The physical therapist should change the bandages at the first post-op appointment. If needed, the dressing supplies have been provided to you. You may shower after this with waterproof bandaids covering the incisions.  7. Physical Therapy: 2 times per week for the first 4 weeks, then 1-2 times per week from weeks 4-8 post-op. Therapy typically starts on post operative Day 3 or 4. You have been provided an order for physical therapy. The therapist will provide home exercises.   8. Work: May return to full work when off of crutches. May do light duty/desk job in  approximately 1-2 weeks when off of narcotics, pain is well-controlled, and swelling has decreased.   9. Post-Op Appointments: Your first post-op appointment will be with Dr. Posey Pronto in approximately 2 weeks time.    If you find that they have not been scheduled please call the Orthopaedic Appointment front desk at [phone].

## 2019-07-18 ENCOUNTER — Other Ambulatory Visit: Payer: Self-pay | Admitting: Orthopedic Surgery

## 2019-07-18 ENCOUNTER — Ambulatory Visit
Admission: RE | Admit: 2019-07-18 | Discharge: 2019-07-18 | Disposition: A | Discharge status: Home / Self Care | Payer: PRIVATE HEALTH INSURANCE | Source: Ambulatory Visit | Attending: Orthopedic Surgery | Admitting: Orthopedic Surgery

## 2019-07-18 ENCOUNTER — Ambulatory Visit
Admission: RE | Admit: 2019-07-18 | Discharge: 2019-07-18 | Disposition: A | Discharge status: Home / Self Care | Payer: No Typology Code available for payment source | Attending: Orthopedic Surgery | Admitting: Orthopedic Surgery

## 2019-07-18 DIAGNOSIS — Z9889 Other specified postprocedural states: Secondary | ICD-10-CM

## 2019-07-18 NOTE — Progress Notes (Signed)
Bilateral standing hip to ankle x-rays on one cassette for evaluation of alignment 

## 2019-09-17 ENCOUNTER — Other Ambulatory Visit: Payer: Self-pay

## 2019-09-17 ENCOUNTER — Ambulatory Visit (INDEPENDENT_AMBULATORY_CARE_PROVIDER_SITE_OTHER): Payer: Self-pay | Admitting: Obstetrics and Gynecology

## 2019-09-17 ENCOUNTER — Encounter: Payer: Self-pay | Admitting: Obstetrics and Gynecology

## 2019-09-17 VITALS — BP 126/74 | Ht 64.0 in | Wt 196.0 lb

## 2019-09-17 DIAGNOSIS — T192XXA Foreign body in vulva and vagina, initial encounter: Secondary | ICD-10-CM

## 2019-09-17 NOTE — Progress Notes (Signed)
Obstetrics & Gynecology Office Visit   Chief Complaint: No chief complaint on file.   History of Present Illness: 49 y.o. [phone] female who has a Yoni ball that she cant removed from her vagina. She has been unable to get it out for the past two days.  She states there is only one inside her. She denies any symptoms, otherwise.    Past Medical History:  Diagnosis Date  . Anemia    vitamin d deficiency  . Arthritis   . Ectopic pregnancy    tube removed  . GERD (gastroesophageal reflux disease)   . Hyperlipidemia   . Hypertension   . Vertigo   . Vitamin D deficiency     Past Surgical History:  Procedure Laterality Date  . HYSTEROSCOPY  03/17/2012  . KNEE ARTHROSCOPY WITH MENISCAL REPAIR Right 06/04/2019   Procedure: RIGHT KNEE ARTHROSCOPY, MEDIAL MENISCUS ROOT REPAIR, CHONDROPLASTY, AND OPEN HIGH TIBIAL OSTEOTOMY;  Surgeon: Leim Fabry, MD;  Location: [facility] ORS;  Service: Orthopedics;  Laterality: Right;  . POLYPECTOMY  03/17/2012   cervical  . SALPINGECTOMY Left    for left ectopic  . TUBAL LIGATION     done in 1994  . tubal reversal      Gynecologic History: No LMP recorded. Patient is premenopausal.  Obstetric History: [id number]  Family History  Problem Relation Age of Onset  . Hypertension Mother   . Hyperlipidemia Mother   . Kidney Stones Mother   . Liver cancer Father        died in his 96s  . Prostate cancer Father   . Colon cancer Father   . Hypertension Maternal Grandmother     Social History   Socioeconomic History  . Marital status: Married    Spouse name: thomas  . Number of children: 3  . Years of education: Not on file  . Highest education level: Not on file  Occupational History  . 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 Smoking status: Never Smoker  . Smokeless tobacco: Never Used  Vaping Use  . Vaping Use: Never used  Substance and Sexual Activity  . Alcohol use: Yes    Comment: occasional glass of  wine  . Drug use: No  . Sexual activity: Yes    Birth control/protection: None  Other Topics Concern  . Not on file  Social History Narrative   Patient lives with husband, 2 daughters and her mother   2 story house   Social Determinants of Health   Financial Resource Strain:   . Difficulty of Paying Living Expenses:   Food Insecurity:   . Worried About Charity fundraiser in the Last Year:   . Arboriculturist in the Last Year:   Transportation Needs:   . Film/video editor (Medical):   Marland Kitchen Lack of Transportation (Non-Medical):   Physical Activity:   . Days of Exercise per Week:   . Minutes of Exercise per Session:   Stress:   . Feeling of Stress :   Social Connections:   . Frequency of Communication with Friends and Family:   . Frequency of Social Gatherings with Friends and Family:   . Attends Religious Services:   . Active Member of Clubs or Organizations:   . Attends Archivist Meetings:   Marland Kitchen Marital Status:   Intimate Partner Violence:   . Fear of Current or Ex-Partner:   . Emotionally Abused:   Marland Kitchen Physically Abused:   .  Sexually Abused:     Allergies  Allergen Reactions  . Grapefruit Extract Swelling    HIGH AMOUNTS OF CITRIC JUICES WILL CAUSE THIS  . Aspirin Nausea And Vomiting    ALL ASPIRIN PRODUCTS    Prior to Admission medications   Medication Sig Start Date End Date Taking? Authorizing Provider  aspirin EC 325 MG EC tablet Take 1 tablet (325 mg total) by mouth daily. 06/05/19   Reche Dixon, PA-C  cholecalciferol (VITAMIN D3) 25 MCG (1000 UNIT) tablet Take 1,000 Units by mouth daily.    [provider]  hydrochlorothiazide (HYDRODIURIL) 12.5 MG tablet Take 1 tablet (12.5 mg total) by mouth daily. 07/23/16   Dalia Heading, CNM  meclizine (ANTIVERT) 25 MG tablet meclizine 25 mg tablet  Take 1 tablet(s) 3 times a day by oral route with meals.    [provider]  methocarbamol (ROBAXIN) 500 MG tablet Take 1 tablet (500 mg  total) by mouth every 6 (six) hours as needed for muscle spasms. 06/05/19   Reche Dixon, PA-C  Multiple Vitamins-Minerals (MULTIVITAMIN WITH MINERALS) tablet Take 1 tablet by mouth daily.    [provider]  Omeprazole 20 MG TBEC omeprazole 20 mg capsule,delayed release    [provider]  ondansetron (ZOFRAN) 4 MG tablet Take 1 tablet (4 mg total) by mouth every 6 (six) hours as needed for nausea. 06/05/19   Reche Dixon, PA-C  oxyCODONE (OXY IR/ROXICODONE) 5 MG immediate release tablet Take 0.5-1 tablets (2.5-5 mg total) by mouth every 4 (four) hours as needed for moderate pain or severe pain (pain score 4-6). 06/05/19   Reche Dixon, PA-C  traMADol (ULTRAM) 50 MG tablet Take 1 tablet (50 mg total) by mouth every 6 (six) hours as needed for moderate pain. 06/05/19   Reche Dixon, PA-C  Vitamin D, Ergocalciferol, (DRISDOL) 50000 units CAPS capsule Take 50,000 Units by mouth every 7 (seven) days. Has not had any of these to take for a while now.    [provider]    Review of Systems  Constitutional: Negative.   HENT: Negative.   Eyes: Negative.   Respiratory: Negative.   Cardiovascular: Negative.   Gastrointestinal: Negative.   Genitourinary: Negative.   Musculoskeletal: Negative.   Skin: Negative.   Neurological: Negative.   Psychiatric/Behavioral: Negative.      Physical Exam BP 126/74   Ht 5\' 4"  (1.626 m)   Wt 196 lb (88.9 kg)   BMI 33.64 kg/m  No LMP recorded. Patient is premenopausal. Physical Exam Constitutional:      General: She is not in acute distress.    Appearance: Normal appearance.  Genitourinary:     Pelvic exam was performed with patient in the lithotomy position.     Vulva, inguinal canal, urethra, bladder and vagina normal.     Genitourinary Comments: A small (~1cm) ball attached to a string found and removed using a single digit.  A follow up vaginal sweep was performed and no additional foreign objects were noted. She tolerated the  procedure well.   HENT:     Head: Normocephalic and atraumatic.  Eyes:     General: No scleral icterus.    Conjunctiva/sclera: Conjunctivae normal.  Neurological:     General: No focal deficit present.     Mental Status: She is alert and oriented to person, place, and time.     Cranial Nerves: No cranial nerve deficit.  Psychiatric:        Mood and Affect: Mood normal.  Behavior: Behavior normal.        Judgment: Judgment normal.     Female chaperone present for pelvic and breast  portions of the physical exam  Assessment: 49 y.o. [id number] female here for  1. Foreign body in vagina, initial encounter      Plan: Problem List Items Addressed This Visit    None    Visit Diagnoses    Foreign body in vagina, initial encounter    -  Primary     Routine follow up  Prentice Docker, MD 09/17/2019 11:30 AM

## 2019-12-21 ENCOUNTER — Other Ambulatory Visit: Payer: Self-pay

## 2019-12-21 ENCOUNTER — Ambulatory Visit: Payer: Self-pay | Admitting: Advanced Practice Midwife

## 2020-01-15 ENCOUNTER — Other Ambulatory Visit: Payer: Self-pay | Admitting: Internal Medicine

## 2020-01-15 DIAGNOSIS — Z1231 Encounter for screening mammogram for malignant neoplasm of breast: Secondary | ICD-10-CM

## 2020-02-18 ENCOUNTER — Ambulatory Visit: Payer: Self-pay | Admitting: Advanced Practice Midwife

## 2020-02-18 ENCOUNTER — Other Ambulatory Visit: Payer: Self-pay

## 2020-02-28 ENCOUNTER — Other Ambulatory Visit: Payer: Self-pay

## 2020-02-28 ENCOUNTER — Encounter: Payer: Self-pay | Admitting: Advanced Practice Midwife

## 2020-02-28 ENCOUNTER — Ambulatory Visit (INDEPENDENT_AMBULATORY_CARE_PROVIDER_SITE_OTHER): Payer: BC Managed Care – PPO | Admitting: Advanced Practice Midwife

## 2020-02-28 VITALS — BP 130/90 | Ht 66.0 in | Wt 196.0 lb

## 2020-02-28 DIAGNOSIS — Z0000 Encounter for general adult medical examination without abnormal findings: Secondary | ICD-10-CM

## 2020-02-28 DIAGNOSIS — Z1211 Encounter for screening for malignant neoplasm of colon: Secondary | ICD-10-CM

## 2020-02-28 DIAGNOSIS — Z1239 Encounter for other screening for malignant neoplasm of breast: Secondary | ICD-10-CM

## 2020-02-28 NOTE — Patient Instructions (Signed)
Health Maintenance, Female Adopting a healthy lifestyle and getting preventive care are important in promoting health and wellness. Ask your health care provider about:  The right schedule for you to have regular tests and exams.  Things you can do on your own to prevent diseases and keep yourself healthy. What should I know about diet, weight, and exercise? Eat a healthy diet   Eat a diet that includes plenty of vegetables, fruits, low-fat dairy products, and lean protein.  Do not eat a lot of foods that are high in solid fats, added sugars, or sodium. Maintain a healthy weight Body mass index (BMI) is used to identify weight problems. It estimates body fat based on height and weight. Your health care provider can help determine your BMI and help you achieve or maintain a healthy weight. Get regular exercise Get regular exercise. This is one of the most important things you can do for your health. Most adults should:  Exercise for at least 150 minutes each week. The exercise should increase your heart rate and make you sweat (moderate-intensity exercise).  Do strengthening exercises at least twice a week. This is in addition to the moderate-intensity exercise.  Spend less time sitting. Even light physical activity can be beneficial. Watch cholesterol and blood lipids Have your blood tested for lipids and cholesterol at 50 years of age, then have this test every 5 years. Have your cholesterol levels checked more often if:  Your lipid or cholesterol levels are high.  You are older than 50 years of age.  You are at high risk for heart disease. What should I know about cancer screening? Depending on your health history and family history, you may need to have cancer screening at various ages. This may include screening for:  Breast cancer.  Cervical cancer.  Colorectal cancer.  Skin cancer.  Lung cancer. What should I know about heart disease, diabetes, and high blood  pressure? Blood pressure and heart disease  High blood pressure causes heart disease and increases the risk of stroke. This is more likely to develop in people who have high blood pressure readings, are of African descent, or are overweight.  Have your blood pressure checked: ? Every 3-5 years if you are 18-39 years of age. ? Every year if you are 40 years old or older. Diabetes Have regular diabetes screenings. This checks your fasting blood sugar level. Have the screening done:  Once every three years after age 40 if you are at a normal weight and have a low risk for diabetes.  More often and at a younger age if you are overweight or have a high risk for diabetes. What should I know about preventing infection? Hepatitis B If you have a higher risk for hepatitis B, you should be screened for this virus. Talk with your health care provider to find out if you are at risk for hepatitis B infection. Hepatitis C Testing is recommended for:  Everyone born from 1945 through 1965.  Anyone with known risk factors for hepatitis C. Sexually transmitted infections (STIs)  Get screened for STIs, including gonorrhea and chlamydia, if: ? You are sexually active and are younger than 50 years of age. ? You are older than 50 years of age and your health care provider tells you that you are at risk for this type of infection. ? Your sexual activity has changed since you were last screened, and you are at increased risk for chlamydia or gonorrhea. Ask your health care provider if   you are at risk.  Ask your health care provider about whether you are at high risk for HIV. Your health care provider may recommend a prescription medicine to help prevent HIV infection. If you choose to take medicine to prevent HIV, you should first get tested for HIV. You should then be tested every 3 months for as long as you are taking the medicine. Pregnancy  If you are about to stop having your period (premenopausal) and  you may become pregnant, seek counseling before you get pregnant.  Take 400 to 800 micrograms (mcg) of folic acid every day if you become pregnant.  Ask for birth control (contraception) if you want to prevent pregnancy. Osteoporosis and menopause Osteoporosis is a disease in which the bones lose minerals and strength with aging. This can result in bone fractures. If you are 65 years old or older, or if you are at risk for osteoporosis and fractures, ask your health care provider if you should:  Be screened for bone loss.  Take a calcium or vitamin D supplement to lower your risk of fractures.  Be given hormone replacement therapy (HRT) to treat symptoms of menopause. Follow these instructions at home: Lifestyle  Do not use any products that contain nicotine or tobacco, such as cigarettes, e-cigarettes, and chewing tobacco. If you need help quitting, ask your health care provider.  Do not use street drugs.  Do not share needles.  Ask your health care provider for help if you need support or information about quitting drugs. Alcohol use  Do not drink alcohol if: ? Your health care provider tells you not to drink. ? You are pregnant, may be pregnant, or are planning to become pregnant.  If you drink alcohol: ? Limit how much you use to 0-1 drink a day. ? Limit intake if you are breastfeeding.  Be aware of how much alcohol is in your drink. In the [place] one drink equals one 12 oz bottle of beer (355 mL), one 5 oz glass of wine (148 mL), or one 1 oz glass of hard liquor (44 mL). General instructions  Schedule regular health, dental, and eye exams.  Stay current with your vaccines.  Tell your health care provider if: ? You often feel depressed. ? You have ever been abused or do not feel safe at home. Summary  Adopting a healthy lifestyle and getting preventive care are important in promoting health and wellness.  Follow your health care provider's instructions about healthy  diet, exercising, and getting tested or screened for diseases.  Follow your health care provider's instructions on monitoring your cholesterol and blood pressure. This information is not intended to replace advice given to you by your health care provider. Make sure you discuss any questions you have with your health care provider. Document Revised: 02/15/2018 Document Reviewed: 02/15/2018 Elsevier Patient Education  2020 Elsevier Inc.  

## 2020-02-28 NOTE — Progress Notes (Signed)
Gynecology Annual Exam  PCP: Glendon Axe, MD  Chief Complaint:  Chief Complaint  Patient presents with  . Gynecologic Exam    No concerns    History of Present Illness:Patient is a 49 y.o. [id number] presents for annual exam. The patient has no gyn complaints today.   LMP: No LMP recorded. Patient is post menopausal. She has not had a period for more than 1 year Postcoital Bleeding: no Dysmenorrhea: not applicable  The patient is sexually active. She denies dyspareunia.  The patient does perform self breast exams.  There is no notable family history of breast or ovarian cancer in her family.  The patient wears seatbelts: yes.   The patient has regular exercise: she has minimal exercise due to knee surgery that she had in March of this year. She admits a healthy diet and adequate hydration. She reports sleep is interrupted by hot flashes.    The patient denies current symptoms of depression.     Review of Systems: Review of Systems  Constitutional: Negative for chills and fever.  HENT: Negative for congestion, ear discharge, ear pain, hearing loss, sinus pain and sore throat.   Eyes: Negative for blurred vision and double vision.  Respiratory: Negative for cough, shortness of breath and wheezing.   Cardiovascular: Negative for chest pain, palpitations and leg swelling.  Gastrointestinal: Negative for abdominal pain, blood in stool, constipation, diarrhea, heartburn, melena, nausea and vomiting.  Genitourinary: Negative for dysuria, flank pain, frequency, hematuria and urgency.  Musculoskeletal: Positive for joint pain. Negative for back pain and myalgias.  Skin: Negative for itching and rash.  Neurological: Positive for dizziness. Negative for tingling, tremors, sensory change, speech change, focal weakness, seizures, loss of consciousness, weakness and headaches.  Endo/Heme/Allergies: Negative for environmental allergies. Does not bruise/bleed easily.   Psychiatric/Behavioral: Negative for depression, hallucinations, memory loss, substance abuse and suicidal ideas. The patient is not nervous/anxious and does not have insomnia.        Positive for anxiety    Past Medical History:  Patient Active Problem List   Diagnosis Date Noted  . Genu varum 06/04/2019  . 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 Menorrhagia 11/01/2017  . Pain in pelvis 11/01/2017  . Polyp at cervical os 11/01/2017  . Gastroesophageal reflux disease without esophagitis 09/29/2016  . Intermittent vertigo 09/29/2016  . Left hand tendonitis 09/29/2016  . Essential hypertension 07/23/2016  . Hyperlipidemia 07/23/2016  . Vertigo 07/23/2016  . Vitamin D deficiency 07/23/2016    Past Surgical History:  Past Surgical History:  Procedure Laterality Date  . HYSTEROSCOPY  03/17/2012  . KNEE ARTHROSCOPY WITH MENISCAL REPAIR Right 06/04/2019   Procedure: RIGHT KNEE ARTHROSCOPY, MEDIAL MENISCUS ROOT REPAIR, CHONDROPLASTY, AND OPEN HIGH TIBIAL OSTEOTOMY;  Surgeon: Leim Fabry, MD;  Location: [facility] ORS;  Service: Orthopedics;  Laterality: Right;  . POLYPECTOMY  03/17/2012   cervical  . SALPINGECTOMY Left    for left ectopic  . TUBAL LIGATION     done in 1994  . tubal reversal      Gynecologic History:  No LMP recorded. Patient is premenopausal. Last Pap: 2020 Results were:  no abnormalities  Last mammogram: no record   Obstetric History: [id number]  Family History:  Family History  Problem Relation Age of Onset  . Hypertension Mother   . Hyperlipidemia Mother   . Kidney Stones Mother   . Liver cancer Father        died in his 2s  . Prostate cancer Father   .  Colon cancer Father   . Hypertension Maternal Grandmother     Social History:  Social History   Socioeconomic History  . Marital status: Married    Spouse name: thomas  . Number of children: 3  . Years of education: Not on file  . Highest education level: Not on file  Occupational History  .  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 Smoking status: Never Smoker  . Smokeless tobacco: Never Used  Vaping Use  . Vaping Use: Never used  Substance and Sexual Activity  . Alcohol use: Yes    Comment: occasional glass of wine  . Drug use: No  . Sexual activity: Yes    Birth control/protection: None  Other Topics Concern  . Not on file  Social History Narrative   Patient lives with husband, 2 daughters and her mother   2 story house   Social Determinants of Health   Financial Resource Strain: Not on file  Food Insecurity: Not on file  Transportation Needs: Not on file  Physical Activity: Not on file  Stress: Not on file  Social Connections: Not on file  Intimate Partner Violence: Not on file    Allergies:  Allergies  Allergen Reactions  . Grapefruit Extract Swelling    HIGH AMOUNTS OF CITRIC JUICES WILL CAUSE THIS  . Aspirin Nausea And Vomiting    ALL ASPIRIN PRODUCTS    Medications: Prior to Admission medications   Medication Sig Start Date End Date Taking? Authorizing Provider  aspirin EC 325 MG EC tablet Take 1 tablet (325 mg total) by mouth daily. 06/05/19  Yes Reche Dixon, PA-C  cholecalciferol (VITAMIN D3) 25 MCG (1000 UNIT) tablet Take 1,000 Units by mouth daily.   Yes [provider]  cyanocobalamin 1000 MCG tablet Take 1 tablet by mouth daily. 02/22/20  Yes [provider]  hydrochlorothiazide (HYDRODIURIL) 12.5 MG tablet Take 1 tablet (12.5 mg total) by mouth daily. 07/23/16  Yes Dalia Heading, CNM  iron polysaccharides (NIFEREX) 150 MG capsule Take by mouth. 09/03/19 03/01/20 Yes [provider]  meclizine (ANTIVERT) 25 MG tablet meclizine 25 mg tablet  Take 1 tablet(s) 3 times a day by oral route with meals.   Yes [provider]  methocarbamol (ROBAXIN) 500 MG tablet Take 1 tablet (500 mg total) by mouth every 6 (six) hours as needed for muscle spasms. 06/05/19  Yes Reche Dixon, PA-C   Multiple Vitamins-Minerals (MULTIVITAMIN WITH MINERALS) tablet Take 1 tablet by mouth daily.   Yes [provider]  Omeprazole 20 MG TBEC omeprazole 20 mg capsule,delayed release   Yes [provider]  ondansetron (ZOFRAN) 4 MG tablet Take 1 tablet (4 mg total) by mouth every 6 (six) hours as needed for nausea. 06/05/19  Yes Reche Dixon, PA-C  oxyCODONE (OXY IR/ROXICODONE) 5 MG immediate release tablet Take 0.5-1 tablets (2.5-5 mg total) by mouth every 4 (four) hours as needed for moderate pain or severe pain (pain score 4-6). 06/05/19  Yes Reche Dixon, PA-C  potassium chloride (KLOR-CON) 10 MEQ tablet Take by mouth. 09/07/19  Yes [provider]  traMADol (ULTRAM) 50 MG tablet Take 1 tablet (50 mg total) by mouth every 6 (six) hours as needed for moderate pain. 06/05/19  Yes Reche Dixon, PA-C  Vitamin D, Ergocalciferol, (DRISDOL) 50000 units CAPS capsule Take 50,000 Units by mouth every 7 (seven) days. Has not had any of these to take for a while now.   Yes  [provider]    Physical Exam Vitals: Blood pressure 130/90, height 5\' 6"  (1.676 m), weight 196 lb (88.9 kg).  General: NAD HEENT: normocephalic, anicteric Thyroid: no enlargement, no palpable nodules Pulmonary: No increased work of breathing, CTAB Cardiovascular: RRR, distal pulses 2+ Breast: Breast symmetrical, no tenderness, no palpable nodules or masses, no skin or nipple retraction present, no nipple discharge.  No axillary or supraclavicular lymphadenopathy. Abdomen: NABS, soft, non-tender, non-distended.  Umbilicus without lesions.  No hepatomegaly, splenomegaly or masses palpable. No evidence of hernia  Genitourinary: deferred for no concerns/PAP interval Extremities: no edema, erythema, or tenderness Neurologic: Grossly intact Psychiatric: mood appropriate, affect full     Assessment: 49 y.o. [id number] routine annual exam  Plan: Problem List Items Addressed This Visit   None   Visit  Diagnoses    Screen for colon cancer    -  Primary   Relevant Orders   Ambulatory referral to Gastroenterology   Well woman exam without gynecological exam       Relevant Orders   MM DIGITAL SCREENING BILATERAL   Ambulatory referral to Gastroenterology   Breast screening       Relevant Orders   MM DIGITAL SCREENING BILATERAL      1) Mammogram - recommend yearly screening mammogram.  Mammogram Was ordered today  2) STI screening  wasoffered and declined  3) ASCCP guidelines and rationale discussed.  Patient opts for every 3 years screening interval  4) Osteoporosis  - per USPTF routine screening DEXA at age 30  Consider FDA-approved medical therapies in postmenopausal women and men aged 82 years and older, based on the following: a) A hip or vertebral (clinical or morphometric) fracture b) T-score ? -2.5 at the femoral neck or spine after appropriate evaluation to exclude secondary causes C) Low bone mass (T-score between -1.0 and -2.5 at the femoral neck or spine) and a 10-year probability of a hip fracture ? 3% or a 10-year probability of a major osteoporosis-related fracture ? 20% based on the [place]-adapted WHO algorithm   5) Routine healthcare maintenance including cholesterol, diabetes screening discussed managed by PCP  6) Colonoscopy: referral to [facility] sent today.  Screening recommended starting at age 35 for average risk individuals, age 80 for individuals deemed at increased risk (including African Americans) and recommended to continue until age 23.  For patient age 50-85 individualized approach is recommended.  Gold standard screening is via colonoscopy, Cologuard screening is an acceptable alternative for patient unwilling or unable to undergo colonoscopy.  "Colorectal cancer screening for average?risk adults: 2018 guideline update from the [place]: A Cancer Journal for Clinicians: Aug 04, 2016   7) Return in about 1 year (around 02/27/2021) for  annual established gyn.   Christean Leaf, CNM [facility] 02/28/20, 3:11 PM

## 2020-03-04 ENCOUNTER — Other Ambulatory Visit: Payer: Self-pay

## 2020-03-04 ENCOUNTER — Telehealth (INDEPENDENT_AMBULATORY_CARE_PROVIDER_SITE_OTHER): Payer: Self-pay | Admitting: Gastroenterology

## 2020-03-04 DIAGNOSIS — Z1211 Encounter for screening for malignant neoplasm of colon: Secondary | ICD-10-CM

## 2020-03-04 DIAGNOSIS — Z800 Family history of malignant neoplasm of digestive organs: Secondary | ICD-10-CM

## 2020-03-04 MED ORDER — PEG 3350-KCL-NA BICARB-NACL 420 G PO SOLR: 4000.0000 mL | Freq: Once | ORAL | 0 refills | Status: AC

## 2020-03-04 NOTE — Progress Notes (Signed)
Gastroenterology Pre-Procedure Review  Request Date: Monday 04/28/20 Requesting Physician: Dr. Servando Snare  PATIENT REVIEW QUESTIONS: The patient responded to the following health history questions as indicated:    1. Are you having any GI issues? no 2. Do you have a personal history of Polyps? no 3. Do you have a family history of Colon Cancer or Polyps? yes (father had colon cancer and liver cancer) 4. Diabetes Mellitus? no 5. Joint replacements in the past 12 months?no 6. Major health problems in the past 3 months?no 7. Any artificial heart valves, MVP, or defibrillator?no    MEDICATIONS & ALLERGIES:    Patient reports the following regarding taking any anticoagulation/antiplatelet therapy:   Plavix, Coumadin, Eliquis, Xarelto, Lovenox, Pradaxa, Brilinta, or Effient? no Aspirin? no  Patient confirms/reports the following medications:  Current Outpatient Medications  Medication Sig Dispense Refill  . cholecalciferol (VITAMIN D3) 25 MCG (1000 UNIT) tablet Take 1,000 Units by mouth daily.    . cyanocobalamin 1000 MCG tablet Take 1 tablet by mouth daily.    . hydrochlorothiazide (HYDRODIURIL) 12.5 MG tablet Take 1 tablet (12.5 mg total) by mouth daily. 30 tablet 1  . meclizine (ANTIVERT) 25 MG tablet meclizine 25 mg tablet  Take 1 tablet(s) 3 times a day by oral route with meals.    . methocarbamol (ROBAXIN) 500 MG tablet Take 1 tablet (500 mg total) by mouth every 6 (six) hours as needed for muscle spasms. 20 tablet 0  . Multiple Vitamins-Minerals (MULTIVITAMIN WITH MINERALS) tablet Take 1 tablet by mouth daily.    . Omeprazole 20 MG TBEC omeprazole 20 mg capsule,delayed release    . ondansetron (ZOFRAN) 4 MG tablet Take 1 tablet (4 mg total) by mouth every 6 (six) hours as needed for nausea. 20 tablet 0  . oxyCODONE (OXY IR/ROXICODONE) 5 MG immediate release tablet Take 0.5-1 tablets (2.5-5 mg total) by mouth every 4 (four) hours as needed for moderate pain or severe pain (pain score 4-6).  30 tablet 0  . potassium chloride (KLOR-CON) 10 MEQ tablet Take by mouth.    . traMADol (ULTRAM) 50 MG tablet Take 1 tablet (50 mg total) by mouth every 6 (six) hours as needed for moderate pain. 30 tablet 0  . Vitamin D, Ergocalciferol, (DRISDOL) 50000 units CAPS capsule Take 50,000 Units by mouth every 7 (seven) days. Has not had any of these to take for a while now.    Marland Kitchen aspirin EC 325 MG EC tablet Take 1 tablet (325 mg total) by mouth daily. (Patient not taking: Reported on 03/04/2020) 30 tablet 0  . iron polysaccharides (NIFEREX) 150 MG capsule Take by mouth.     No current facility-administered medications for this visit.    Patient confirms/reports the following allergies:  Allergies  Allergen Reactions  . Grapefruit Extract Swelling    HIGH AMOUNTS OF CITRIC JUICES WILL CAUSE THIS  . Aspirin Nausea And Vomiting    ALL ASPIRIN PRODUCTS    No orders of the defined types were placed in this encounter.   AUTHORIZATION INFORMATION Primary Insurance: 1D#: Group #:  Secondary Insurance: 1D#: Group #:  SCHEDULE INFORMATION: Date:  Time: Location:

## 2020-04-22 ENCOUNTER — Telehealth: Payer: Self-pay

## 2020-04-22 NOTE — Telephone Encounter (Signed)
Patients call has been returned to reschedule her colonoscopy.  Colonoscopy has been rescheduled to 06/09/20 [facility]).  Patient has been advised of COVID test 03/31 Elizabeth Terry).  [place] notified.  Thanks,  [place], [place]

## 2020-05-07 ENCOUNTER — Telehealth: Payer: Self-pay

## 2020-05-07 NOTE — Telephone Encounter (Signed)
Patient has a question about Thyroid hormone level w/TSH Level 1, pregnancy Trimester 1, Trimester 2? QK#[phone]

## 2020-05-08 NOTE — Telephone Encounter (Signed)
Spoke w/patient. She states she had labs done at the hospital ordered by her PCP. She has an apt w/PCP Tuesday Advised to contact PCP/ordering provider to discuss.

## 2020-06-05 ENCOUNTER — Other Ambulatory Visit: Payer: Self-pay

## 2020-06-05 ENCOUNTER — Encounter: Payer: Self-pay | Admitting: Gastroenterology

## 2020-06-12 ENCOUNTER — Other Ambulatory Visit
Admission: RE | Admit: 2020-06-12 | Discharge: 2020-06-12 | Disposition: A | Discharge status: Home / Self Care | Payer: Self-pay | Source: Ambulatory Visit | Attending: Gastroenterology | Admitting: Gastroenterology

## 2020-06-12 ENCOUNTER — Other Ambulatory Visit: Payer: Self-pay

## 2020-06-12 DIAGNOSIS — Z20822 Contact with and (suspected) exposure to covid-19: Secondary | ICD-10-CM | POA: Insufficient documentation

## 2020-06-12 DIAGNOSIS — Z01812 Encounter for preprocedural laboratory examination: Secondary | ICD-10-CM | POA: Insufficient documentation

## 2020-06-13 LAB — SARS CORONAVIRUS 2 (TAT 6-24 HRS): SARS Coronavirus 2: NEGATIVE

## 2020-06-13 NOTE — Discharge Instructions (Signed)

## 2020-06-16 ENCOUNTER — Ambulatory Visit: Payer: Self-pay | Admitting: Anesthesiology

## 2020-06-16 ENCOUNTER — Other Ambulatory Visit: Payer: Self-pay

## 2020-06-16 ENCOUNTER — Encounter
Admission: RE | Disposition: A | Discharge status: Home / Self Care | Payer: Self-pay | Source: Home / Self Care | Attending: Gastroenterology

## 2020-06-16 ENCOUNTER — Ambulatory Visit
Admission: RE | Admit: 2020-06-16 | Discharge: 2020-06-16 | Disposition: A | Discharge status: Home / Self Care | Payer: Self-pay | Attending: Gastroenterology | Admitting: Gastroenterology

## 2020-06-16 ENCOUNTER — Encounter: Payer: Self-pay | Admitting: Gastroenterology

## 2020-06-16 DIAGNOSIS — Z79899 Other long term (current) drug therapy: Secondary | ICD-10-CM | POA: Insufficient documentation

## 2020-06-16 DIAGNOSIS — Z886 Allergy status to analgesic agent status: Secondary | ICD-10-CM | POA: Insufficient documentation

## 2020-06-16 DIAGNOSIS — Z800 Family history of malignant neoplasm of digestive organs: Secondary | ICD-10-CM

## 2020-06-16 DIAGNOSIS — Z7982 Long term (current) use of aspirin: Secondary | ICD-10-CM | POA: Insufficient documentation

## 2020-06-16 DIAGNOSIS — Z91018 Allergy to other foods: Secondary | ICD-10-CM | POA: Insufficient documentation

## 2020-06-16 DIAGNOSIS — Z1211 Encounter for screening for malignant neoplasm of colon: Secondary | ICD-10-CM

## 2020-06-16 HISTORY — PX: COLONOSCOPY WITH PROPOFOL: SHX5780

## 2020-06-16 SURGERY — COLONOSCOPY WITH PROPOFOL
Anesthesia: General | Site: Rectum

## 2020-06-16 MED ORDER — LACTATED RINGERS IV SOLN: INTRAVENOUS | Status: DC

## 2020-06-16 MED ORDER — LIDOCAINE HCL (CARDIAC) PF 100 MG/5ML IV SOSY
PREFILLED_SYRINGE | INTRAVENOUS | Status: DC | PRN
  Administered 2020-06-16: 60 mg via INTRAVENOUS

## 2020-06-16 MED ORDER — ACETAMINOPHEN 160 MG/5ML PO SOLN: 325.0000 mg | Freq: Once | ORAL | Status: DC

## 2020-06-16 MED ORDER — PROPOFOL 10 MG/ML IV BOLUS
INTRAVENOUS | Status: DC | PRN
  Administered 2020-06-16 (×2): 30 mg via INTRAVENOUS
  Administered 2020-06-16: 70 mg via INTRAVENOUS
  Administered 2020-06-16: 30 mg via INTRAVENOUS

## 2020-06-16 MED ORDER — ACETAMINOPHEN 325 MG PO TABS: 325.0000 mg | ORAL_TABLET | Freq: Once | ORAL | Status: DC

## 2020-06-16 MED ORDER — STERILE WATER FOR IRRIGATION IR SOLN: Status: DC | PRN

## 2020-06-16 SURGICAL SUPPLY — 6 items
GOWN CVR UNV OPN BCK APRN NK (MISCELLANEOUS) ×2 IMPLANT
GOWN ISOL THUMB LOOP REG UNIV (MISCELLANEOUS) ×4
KIT PRC NS LF DISP ENDO (KITS) ×1 IMPLANT
KIT PROCEDURE OLYMPUS (KITS) ×2
MANIFOLD NEPTUNE II (INSTRUMENTS) ×2 IMPLANT
WATER STERILE IRR 250ML POUR (IV SOLUTION) ×2 IMPLANT

## 2020-06-16 NOTE — Op Note (Signed)
[facility] Gastroenterology Patient Name: Elizabeth Terry Procedure Date: 06/16/2020 9:07 AM MRN: [id number] Account #: [id number] Date of Birth: 1970-07-22 Admit Type: Outpatient Age: 50 Room: [facility] OR ROOM 01 Gender: Female Note Status: Finalized Procedure:             Colonoscopy Indications:           Screening for colorectal malignant neoplasm, Family                         history of colon cancer in a first-degree relative                         before age 57 years Providers:             [name] MD, MD Referring MD:          Glendon Axe (Referring MD) Medicines:             Propofol per Anesthesia Complications:         No immediate complications. Procedure:             Pre-Anesthesia Assessment:                        - Prior to the procedure, a History and Physical was                         performed, and patient medications and allergies were                         reviewed. The patient's tolerance of previous                         anesthesia was also reviewed. The risks and benefits                         of the procedure and the sedation options and risks                         were discussed with the patient. All questions were                         answered, and informed consent was obtained. Prior                         Anticoagulants: The patient has taken no previous                         anticoagulant or antiplatelet agents. ASA Grade                         Assessment: II - A patient with mild systemic disease.                         After reviewing the risks and benefits, the patient                         was deemed in satisfactory condition to undergo the  procedure.                        After obtaining informed consent, the colonoscope was                         passed under direct vision. Throughout the procedure,                         the patient's blood pressure, pulse, and oxygen                          saturations were monitored continuously. The                         Colonoscope was introduced through the anus and                         advanced to the the cecum, identified by appendiceal                         orifice and ileocecal valve. The colonoscopy was                         performed without difficulty. The patient tolerated                         the procedure well. The quality of the bowel                         preparation was excellent. Findings:      The perianal and digital rectal examinations were normal.      The colon (entire examined portion) appeared normal. Impression:            - The entire examined colon is normal.                        - No specimens collected. Recommendation:        - Discharge patient to home.                        - Resume previous diet.                        - Continue present medications.                        - Repeat colonoscopy in 5 years for surveillance. Procedure Code(s):     --- Professional ---                        [phone], Colonoscopy, flexible; diagnostic, including                         collection of specimen(s) by brushing or washing, when                         performed (separate procedure) Diagnosis Code(s):     --- Professional ---  Z12.11, Encounter for screening for malignant neoplasm                         of colon                        Z80.0, Family history of malignant neoplasm of                         digestive organs CPT copyright 2019 American Medical Association. All rights reserved. The codes documented in this report are preliminary and upon coder review may  be revised to meet current compliance requirements. [name] MD, MD 06/16/2020 9:25:15 AM This report has been signed electronically. Number of Addenda: 0 Note Initiated On: 06/16/2020 9:07 AM Scope Withdrawal Time: 0 hours 8 minutes 56 seconds  Total Procedure Duration: 0 hours 12 minutes 29  seconds  Estimated Blood Loss:  Estimated blood loss: none.      [facility]

## 2020-06-16 NOTE — Anesthesia Procedure Notes (Signed)
Date/Time: 06/16/2020 9:06 AM Performed by: Dionne Bucy, CRNA Pre-anesthesia Checklist: Patient identified, Emergency Drugs available, Suction available, Patient being monitored and Timeout performed Placement Confirmation: positive ETCO2

## 2020-06-16 NOTE — Anesthesia Postprocedure Evaluation (Signed)
Anesthesia Post Note  Patient: Elizabeth Terry  Procedure(s) Performed: COLONOSCOPY WITH PROPOFOL (N/A Rectum)     Patient location during evaluation: PACU Anesthesia Type: General Level of consciousness: awake and alert and oriented Pain management: satisfactory to patient Vital Signs Assessment: post-procedure vital signs reviewed and stable Respiratory status: spontaneous breathing, nonlabored ventilation and respiratory function stable Cardiovascular status: blood pressure returned to baseline and stable Postop Assessment: Adequate PO intake and No signs of nausea or vomiting Anesthetic complications: no   No complications documented.  Raliegh Ip

## 2020-06-16 NOTE — H&P (Signed)
Elizabeth Lame, MD [facility] [address] [place], [place] [address] [phone] Fax : [phone]  Primary Care Physician:  Elizabeth Axe, MD Primary Gastroenterologist:  Dr. Allen Terry  Pre-Procedure History & Physical: HPI:  Elizabeth Terry is a 50 y.o. female is here for an colonoscopy.   Past Medical History:  Diagnosis Date  . Anemia    vitamin d deficiency  . Arthritis   . Ectopic pregnancy    tube removed  . GERD (gastroesophageal reflux disease)   . Hyperlipidemia   . Hypertension   . Vertigo   . Vitamin D deficiency     Past Surgical History:  Procedure Laterality Date  . HYSTEROSCOPY  03/17/2012  . KNEE ARTHROSCOPY WITH MENISCAL REPAIR Right 06/04/2019   Procedure: RIGHT KNEE ARTHROSCOPY, MEDIAL MENISCUS ROOT REPAIR, CHONDROPLASTY, AND OPEN HIGH TIBIAL OSTEOTOMY;  Surgeon: Elizabeth Fabry, MD;  Location: [facility] ORS;  Service: Orthopedics;  Laterality: Right;  . POLYPECTOMY  03/17/2012   cervical  . SALPINGECTOMY Left    for left ectopic  . TUBAL LIGATION     done in 1994  . tubal reversal      Prior to Admission medications   Medication Sig Start Date End Date Taking? Authorizing Provider  aspirin EC 325 MG EC tablet Take 1 tablet (325 mg total) by mouth daily. 06/05/19  Yes Elizabeth Dixon, PA-C  cholecalciferol (VITAMIN D3) 25 MCG (1000 UNIT) tablet Take 1,000 Units by mouth daily.   Yes [provider]  cyanocobalamin 1000 MCG tablet Take 1 tablet by mouth daily. 02/22/20  Yes [provider]  hydrochlorothiazide (HYDRODIURIL) 12.5 MG tablet Take 1 tablet (12.5 mg total) by mouth daily. 07/23/16  Yes Elizabeth Terry, CNM  meclizine (ANTIVERT) 25 MG tablet meclizine 25 mg tablet  Take 1 tablet(s) 3 times a day by oral route with meals.   Yes [provider]  Multiple Vitamins-Minerals (MULTIVITAMIN WITH MINERALS) tablet Take 1 tablet by mouth daily.   Yes [provider]  Omeprazole 20 MG TBEC omeprazole 20 mg  capsule,delayed release   Yes [provider]  ondansetron (ZOFRAN) 4 MG tablet Take 1 tablet (4 mg total) by mouth every 6 (six) hours as needed for nausea. 06/05/19  Yes Elizabeth Dixon, PA-C  potassium chloride (KLOR-CON) 10 MEQ tablet Take by mouth. 09/07/19  Yes [provider]  Vitamin D, Ergocalciferol, (DRISDOL) 50000 units CAPS capsule Take 50,000 Units by mouth every 7 (seven) days. Has not had any of these to take for a while now.   Yes [provider]  iron polysaccharides (NIFEREX) 150 MG capsule Take by mouth. 09/03/19 03/01/20  [provider]  methocarbamol (ROBAXIN) 500 MG tablet Take 1 tablet (500 mg total) by mouth every 6 (six) hours as needed for muscle spasms. Patient not taking: Reported on 06/05/2020 06/05/19   Elizabeth Dixon, PA-C  oxyCODONE (OXY IR/ROXICODONE) 5 MG immediate release tablet Take 0.5-1 tablets (2.5-5 mg total) by mouth every 4 (four) hours as needed for moderate pain or severe pain (pain score 4-6). Patient not taking: Reported on 06/05/2020 06/05/19   Elizabeth Dixon, PA-C  traMADol (ULTRAM) 50 MG tablet Take 1 tablet (50 mg total) by mouth every 6 (six) hours as needed for moderate pain. Patient not taking: Reported on 06/05/2020 06/05/19   Elizabeth Dixon, PA-C    Allergies as of 03/04/2020 - Review Complete 03/04/2020  Allergen Reaction Noted  . Grapefruit extract Swelling 09/29/2016  . Aspirin Nausea And Vomiting     Family  History  Problem Relation Age of Onset  . Hypertension Mother   . Hyperlipidemia Mother   . Kidney Stones Mother   . Liver cancer Father        died in his 28s  . Prostate cancer Father   . Colon cancer Father   . Hypertension Maternal Grandmother     Social History   Socioeconomic History  . Marital status: Married    Spouse name: Elizabeth Terry  . Number of children: 3  . Years of education: Not on file  . Highest education level: Not on file  Occupational History  . 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 Smoking status: Never Smoker  . Smokeless tobacco: Never Used  Vaping Use  . Vaping Use: Never used  Substance and Sexual Activity  . Alcohol use: Yes    Comment: occasional glass of wine  . Drug use: No  . Sexual activity: Yes    Birth control/protection: None  Other Topics Concern  . Not on file  Social History Narrative   Patient lives with husband, 2 daughters and her mother   2 story house   Social Determinants of Health   Financial Resource Strain: Not on file  Food Insecurity: Not on file  Transportation Needs: Not on file  Physical Activity: Not on file  Stress: Not on file  Social Connections: Not on file  Intimate Partner Violence: Not on file    Review of Systems: See HPI, otherwise negative ROS  Physical Exam: BP (!) 141/98   Pulse 72   Temp 97.6 F (36.4 C)   Ht 5\' 6"  (1.676 m)   Wt 91.2 kg   SpO2 97%   BMI 32.44 kg/m  General:   Alert,  pleasant and cooperative in NAD Head:  Normocephalic and atraumatic. Neck:  Supple; no masses or thyromegaly. Lungs:  Clear throughout to auscultation.    Heart:  Regular rate and rhythm. Abdomen:  Soft, nontender and nondistended. Normal bowel sounds, without guarding, and without rebound.   Neurologic:  Alert and  oriented x4;  grossly normal neurologically.  Impression/Plan: Elizabeth Terry is here for an colonoscopy to be performed for father with colon cancer at 45  Risks, benefits, limitations, and alternatives regarding  colonoscopy have been reviewed with the patient.  Questions have been answered.  All parties agreeable.   Elizabeth Lame, MD  06/16/2020, 9:01 AM

## 2020-06-16 NOTE — Anesthesia Preprocedure Evaluation (Signed)
Anesthesia Evaluation  Patient identified by MRN, date of birth, ID band Patient awake    Reviewed: Allergy & Precautions, H&P , NPO status , Patient's Chart, lab work & pertinent test results  Airway Mallampati: II  TM Distance: >3 FB Neck ROM: full    Dental no notable dental hx.    Pulmonary    Pulmonary exam normal breath sounds clear to auscultation       Cardiovascular hypertension, Normal cardiovascular exam Rhythm:regular Rate:Normal     Neuro/Psych    GI/Hepatic GERD  ,  Endo/Other  obesity  Renal/GU      Musculoskeletal   Abdominal   Peds  Hematology   Anesthesia Other Findings   Reproductive/Obstetrics                             Anesthesia Physical Anesthesia Plan  ASA: II  Anesthesia Plan: General   Post-op Pain Management:    Induction: Intravenous  PONV Risk Score and Plan: 3 and Treatment may vary due to age or medical condition, TIVA and Propofol infusion  Airway Management Planned: Natural Airway  Additional Equipment:   Intra-op Plan:   Post-operative Plan:   Informed Consent: I have reviewed the patients History and Physical, chart, labs and discussed the procedure including the risks, benefits and alternatives for the proposed anesthesia with the patient or authorized representative who has indicated his/her understanding and acceptance.     Dental Advisory Given  Plan Discussed with: CRNA  Anesthesia Plan Comments:         Anesthesia Quick Evaluation

## 2020-06-16 NOTE — Transfer of Care (Signed)
Immediate Anesthesia Transfer of Care Note  Patient: Elizabeth Terry  Procedure(s) Performed: COLONOSCOPY WITH PROPOFOL (N/A Rectum)  Patient Location: PACU  Anesthesia Type: General  Level of Consciousness: awake, alert  and patient cooperative  Airway and Oxygen Therapy: Patient Spontanous Breathing and Patient connected to supplemental oxygen  Post-op Assessment: Post-op Vital signs reviewed, Patient's Cardiovascular Status Stable, Respiratory Function Stable, Patent Airway and No signs of Nausea or vomiting  Post-op Vital Signs: Reviewed and stable  Complications: No complications documented.

## 2020-06-17 ENCOUNTER — Encounter: Payer: Self-pay | Admitting: Gastroenterology

## 2020-06-28 ENCOUNTER — Encounter: Payer: Self-pay | Admitting: Obstetrics and Gynecology

## 2020-08-19 ENCOUNTER — Other Ambulatory Visit: Payer: Self-pay

## 2020-08-19 ENCOUNTER — Emergency Department: Payer: No Typology Code available for payment source

## 2020-08-19 ENCOUNTER — Emergency Department
Admission: EM | Admit: 2020-08-19 | Discharge: 2020-08-19 | Disposition: A | Discharge status: Home / Self Care | Payer: No Typology Code available for payment source | Attending: Emergency Medicine | Admitting: Emergency Medicine

## 2020-08-19 DIAGNOSIS — Z79899 Other long term (current) drug therapy: Secondary | ICD-10-CM | POA: Diagnosis not present

## 2020-08-19 DIAGNOSIS — I10 Essential (primary) hypertension: Secondary | ICD-10-CM | POA: Insufficient documentation

## 2020-08-19 DIAGNOSIS — S29012A Strain of muscle and tendon of back wall of thorax, initial encounter: Secondary | ICD-10-CM | POA: Insufficient documentation

## 2020-08-19 DIAGNOSIS — Z7982 Long term (current) use of aspirin: Secondary | ICD-10-CM | POA: Insufficient documentation

## 2020-08-19 DIAGNOSIS — S199XXA Unspecified injury of neck, initial encounter: Secondary | ICD-10-CM | POA: Diagnosis present

## 2020-08-19 DIAGNOSIS — S29019A Strain of muscle and tendon of unspecified wall of thorax, initial encounter: Secondary | ICD-10-CM

## 2020-08-19 DIAGNOSIS — Y92410 Unspecified street and highway as the place of occurrence of the external cause: Secondary | ICD-10-CM | POA: Insufficient documentation

## 2020-08-19 DIAGNOSIS — S161XXA Strain of muscle, fascia and tendon at neck level, initial encounter: Secondary | ICD-10-CM | POA: Diagnosis not present

## 2020-08-19 MED ORDER — CYCLOBENZAPRINE HCL 5 MG PO TABS: 5.0000 mg | ORAL_TABLET | Freq: Three times a day (TID) | ORAL | 0 refills | Status: AC | PRN

## 2020-08-19 MED ORDER — HYDROCODONE-ACETAMINOPHEN 5-325 MG PO TABS
1.0000 | ORAL_TABLET | ORAL | Status: AC
  Administered 2020-08-19: 1 via ORAL
  Filled 2020-08-19: qty 1

## 2020-08-19 MED ORDER — HYDROCODONE-ACETAMINOPHEN 5-325 MG PO TABS: 1.0000 | ORAL_TABLET | ORAL | 0 refills | Status: DC | PRN

## 2020-08-19 MED ORDER — CYCLOBENZAPRINE HCL 10 MG PO TABS
10.0000 mg | ORAL_TABLET | Freq: Once | ORAL | Status: AC
  Administered 2020-08-19: 10 mg via ORAL
  Filled 2020-08-19: qty 1

## 2020-08-19 NOTE — Discharge Instructions (Addendum)
Please take pain medication and muscle relaxer as prescribed as needed for pain.  Perform gentle stretching exercises and walk to help prevent muscle stiffness and tightness.  Apply ice to the neck and thoracic muscles 20 minutes every hour for the next 2 to 3 days and after that 2 to 3 days you can transition over to heat.  Return to the ER for any worsening symptoms or urgent changes in your health.
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
## 2020-08-21 ENCOUNTER — Other Ambulatory Visit: Payer: Self-pay | Admitting: Internal Medicine

## 2020-08-21 DIAGNOSIS — Z1231 Encounter for screening mammogram for malignant neoplasm of breast: Secondary | ICD-10-CM

## 2020-09-16 DIAGNOSIS — R7309 Other abnormal glucose: Secondary | ICD-10-CM | POA: Insufficient documentation

## 2020-10-15 ENCOUNTER — Other Ambulatory Visit: Payer: Self-pay

## 2020-10-15 ENCOUNTER — Ambulatory Visit
Admission: RE | Admit: 2020-10-15 | Discharge: 2020-10-15 | Disposition: A | Discharge status: Home / Self Care | Payer: BC Managed Care – PPO | Source: Ambulatory Visit

## 2020-10-15 DIAGNOSIS — Z1231 Encounter for screening mammogram for malignant neoplasm of breast: Secondary | ICD-10-CM

## 2020-10-20 ENCOUNTER — Other Ambulatory Visit: Payer: Self-pay | Admitting: Internal Medicine

## 2020-10-20 DIAGNOSIS — R928 Other abnormal and inconclusive findings on diagnostic imaging of breast: Secondary | ICD-10-CM

## 2020-10-23 ENCOUNTER — Other Ambulatory Visit: Payer: Self-pay | Admitting: Advanced Practice Midwife

## 2020-10-23 DIAGNOSIS — R928 Other abnormal and inconclusive findings on diagnostic imaging of breast: Secondary | ICD-10-CM

## 2020-10-24 ENCOUNTER — Other Ambulatory Visit: Payer: Self-pay | Admitting: Internal Medicine

## 2020-10-25 ENCOUNTER — Ambulatory Visit
Admission: RE | Admit: 2020-10-25 | Discharge: 2020-10-25 | Disposition: A | Discharge status: Home / Self Care | Payer: BC Managed Care – PPO | Source: Ambulatory Visit | Attending: Internal Medicine | Admitting: Internal Medicine

## 2020-10-25 ENCOUNTER — Other Ambulatory Visit: Payer: Self-pay

## 2020-10-25 DIAGNOSIS — R928 Other abnormal and inconclusive findings on diagnostic imaging of breast: Secondary | ICD-10-CM

## 2021-01-09 ENCOUNTER — Other Ambulatory Visit: Payer: Self-pay

## 2021-01-09 ENCOUNTER — Encounter: Payer: Self-pay | Admitting: Advanced Practice Midwife

## 2021-01-09 ENCOUNTER — Ambulatory Visit: Payer: BC Managed Care – PPO | Admitting: Advanced Practice Midwife

## 2021-01-09 VITALS — BP 130/70 | Ht 66.0 in | Wt 220.0 lb

## 2021-01-09 DIAGNOSIS — N950 Postmenopausal bleeding: Secondary | ICD-10-CM | POA: Diagnosis not present

## 2021-01-09 NOTE — Addendum Note (Signed)
Addended by: Rod Can on: 01/09/2021 04:24 PM   Modules accepted: Orders

## 2021-01-09 NOTE — Progress Notes (Signed)
Patient ID: Elizabeth Terry, female   DOB: 08-06-1970, 50 y.o.   MRN: [id number]  Reason for Consult: Vaginal Bleeding (Pt started bleeding yesterday, before this bleeding nothing for the past 6 months approximately, a lot of pelvic pain)   Subjective:  HPI:  Elizabeth Terry is a 50 y.o. female being seen for bleeding that began yesterday. She reports last period was approximately 2 years ago. About 6 months ago she had less than 1 day of spotting. The current bleeding is the heaviness of a period accompanied by strong cramping. The bleeding is unassociated with any event. She has no other concerns today. She had a normal PAP smear 2 years ago. She has a history of hysteroscopy in 2014 and a history of left salpingectomy.   Past Medical History:  Diagnosis Date   Anemia    vitamin d deficiency   Arthritis    Ectopic pregnancy    tube removed   GERD (gastroesophageal reflux disease)    Hyperlipidemia    Hypertension    Vertigo    Vitamin D deficiency    Family History  Problem Relation Age of Onset   Hypertension Mother    Hyperlipidemia Mother    Kidney Stones Mother    Liver cancer Father        died in his 67s   Prostate cancer Father    Colon cancer Father    Hypertension Maternal Grandmother    Past Surgical History:  Procedure Laterality Date   COLONOSCOPY WITH PROPOFOL N/A 06/16/2020   Procedure: COLONOSCOPY WITH PROPOFOL;  Surgeon: [name], MD;  Location: [place];  Service: Endoscopy;  Laterality: N/A;  priority 4   HYSTEROSCOPY  03/17/2012   KNEE ARTHROSCOPY WITH MENISCAL REPAIR Right 06/04/2019   Procedure: RIGHT KNEE ARTHROSCOPY, MEDIAL MENISCUS ROOT REPAIR, CHONDROPLASTY, AND OPEN HIGH TIBIAL OSTEOTOMY;  Surgeon: Leim Fabry, MD;  Location: [facility];  Service: Orthopedics;  Laterality: Right;   POLYPECTOMY  03/17/2012   cervical   SALPINGECTOMY Left    for left ectopic   TUBAL LIGATION     done in 1994   tubal reversal      Short Social  History:  Social History   Tobacco Use   Smoking status: Never   Smokeless tobacco: Never  Substance Use Topics   Alcohol use: Yes    Comment: occasional glass of wine    Allergies  Allergen Reactions   Grapefruit Extract Swelling    HIGH AMOUNTS OF CITRIC JUICES WILL CAUSE THIS   Aspirin Nausea And Vomiting    ALL ASPIRIN PRODUCTS    Current Outpatient Medications  Medication Sig Dispense Refill   cyclobenzaprine (FLEXERIL) 5 MG tablet Take 1-2 tablets (5-10 mg total) by mouth 3 (three) times daily as needed for muscle spasms. 20 tablet 0   hydrochlorothiazide (HYDRODIURIL) 12.5 MG tablet Take 1 tablet (12.5 mg total) by mouth daily. 30 tablet 1   meclizine (ANTIVERT) 25 MG tablet meclizine 25 mg tablet  Take 1 tablet(s) 3 times a day by oral route with meals.     Multiple Vitamins-Minerals (MULTIVITAMIN WITH MINERALS) tablet Take 1 tablet by mouth daily.     potassium chloride (KLOR-CON) 10 MEQ tablet Take by mouth.     Vitamin D, Ergocalciferol, (DRISDOL) 50000 units CAPS capsule Take 50,000 Units by mouth every 7 (seven) days. Has not had any of these to take for a while now.     iron polysaccharides (NIFEREX) 150 MG capsule Take by mouth.  No current facility-administered medications for this visit.    Review of Systems  Constitutional:  Negative for chills and fever.  HENT:  Negative for congestion, ear discharge, ear pain, hearing loss, sinus pain and sore throat.   Eyes:  Negative for blurred vision and double vision.  Respiratory:  Negative for cough, shortness of breath and wheezing.   Cardiovascular:  Negative for chest pain, palpitations and leg swelling.  Gastrointestinal:  Negative for abdominal pain, blood in stool, constipation, diarrhea, heartburn, melena, nausea and vomiting.  Genitourinary:  Negative for dysuria, flank pain, frequency, hematuria and urgency.  Musculoskeletal:  Negative for back pain, joint pain and myalgias.  Skin:  Negative for itching  and rash.  Neurological:  Positive for dizziness. Negative for tingling, tremors, sensory change, speech change, focal weakness, seizures, loss of consciousness, weakness and headaches.  Endo/Heme/Allergies:  Negative for environmental allergies. Does not bruise/bleed easily.       Positive for vaginal bleeding, hot flashes  Psychiatric/Behavioral:  Negative for depression, hallucinations, memory loss, substance abuse and suicidal ideas. The patient is not nervous/anxious and does not have insomnia.        Objective:  Objective   Vitals:   01/09/21 1402  BP: 130/70  Weight: 220 lb (99.8 kg)  Height: 5\' 6"  (1.676 m)   Body mass index is 35.51 kg/m. Constitutional: Well nourished, well developed female in no acute distress.  HEENT: normal Skin: Warm and dry.  Cardiovascular: Regular rate and rhythm.   Extremity:  no edema   Respiratory: Clear to auscultation bilateral. Normal respiratory effort Abdomen: obese Neuro: DTRs 2+, Cranial nerves grossly intact Psych: Alert and Oriented x3. No memory deficits. Normal mood and affect.    Pelvic exam:  is limited by body habitus EGBUS: within normal limits Vagina: within normal limits and with normal mucosa, blood in the vault Cervix: grossly normal appearance, active bleeding Uterus: unable to palpate any masses   Assessment/Plan:     50 y.o. G61 P3013 female with post menopausal bleeding  Gyn ultrasound ordered Follow up after ultrasound     [facility] 01/09/2021, 3:37 PM

## 2021-01-09 NOTE — Patient Instructions (Addendum)
Postmenopausal Bleeding Postmenopausal bleeding is any bleeding that a woman has after she has entered menopause. Menopause is the end of a woman's fertile years. After menopause, a woman no longer ovulates and does not have menstrual periods. Therefore, she should no longer have bleeding from her vagina. Postmenopausal bleeding may have various causes, including: Menopausal hormone therapy (MHT). Endometrial atrophy. After menopause, low estrogen hormone levels cause the membrane that lines the uterus (endometrium) to become thin. You may have bleeding as the endometrium thins. Endometrial hyperplasia. This condition is caused by excess estrogen hormones and low levels of progesterone hormones. The excess estrogen causes the endometrium to thicken, which can lead to bleeding. In some cases, this can lead to cancer of the uterus. Endometrial cancer. Noncancerous growths (polyps) on the endometrium, the lining of the uterus, or the cervix. Uterine fibroids. These are noncancerous growths in or around the uterus muscle tissue that can cause heavy bleeding. Any type of postmenopausal bleeding, even if it appears to be a typical menstrual period, should be checked by your health care provider. Treatment will depend on the cause of the bleeding. Follow these instructions at home:  Pay attention to any changes in your symptoms. Let your health care provider know about them. Avoid using tampons and douches as told by your health care provider. Change your pads regularly. Get regular pelvic exams, including Pap tests, as told by your health care provider. Take iron supplements as told by your health care provider. Take over-the-counter and prescription medicines only as told by your health care provider. Keep all follow-up visits. This is important. Contact a health care provider if: You have new bleeding from the vagina after menopause. You have pain in your abdomen. Get help right away if: You have  a fever or chills. You have severe pain with bleeding. You are passing blood clots. You have heavy bleeding, need more than 1 pad an hour, and have never experienced this before. You have headaches or feel faint or dizzy. Summary Postmenopausal bleeding is any bleeding that a woman has after she has entered into menopause. Postmenopausal bleeding may have various causes. Treatment will depend on the cause of the bleeding. Any type of postmenopausal bleeding, even if it appears to be a typical menstrual period, should be checked by your health care provider. Be sure to pay attention to any changes in your symptoms and keep all follow-up visits. This information is not intended to replace advice given to you by your health care provider. Make sure you discuss any questions you have with your health care provider. Document Revised: 08/09/2019 Document Reviewed: 08/09/2019 Elsevier Patient Education  2022 [place]. Menopause Menopause is the normal time of a woman's life when menstrual periods stop completely. It marks the natural end to a woman's ability to become pregnant. It can be defined as the absence of a menstrual period for 12 months without another medical cause. The transition to menopause (perimenopause) most often happens between the ages of 72 and 62, and can last for many years. During perimenopause, hormone levels change in your body, which can cause symptoms and affect your health. Menopause may increase your risk for: Weakened bones (osteoporosis), which causes fractures. Depression. Hardening and narrowing of the arteries (atherosclerosis), which can cause heart attacks and strokes. What are the causes? This condition is usually caused by a natural change in hormone levels that happens as you get older. The condition may also be caused by changes that are not natural, including: Surgery  to remove both ovaries (surgical menopause). Side effects from some medicines, such as  chemotherapy used to treat cancer (chemical menopause). What increases the risk? This condition is more likely to start at an earlier age if you have certain medical conditions or have undergone treatments, including: A tumor of the pituitary gland in the brain. A disease that affects the ovaries and hormones. Certain cancer treatments, such as chemotherapy or hormone therapy, or radiation therapy on the pelvis. Heavy smoking and excessive alcohol use. Family history of early menopause. This condition is also more likely to develop earlier in women who are very thin. What are the signs or symptoms? Symptoms of this condition include: Hot flashes. Irregular menstrual periods. Night sweats. Changes in feelings about sex. This could be a decrease in sex drive or an increased discomfort around your sexuality. Vaginal dryness and thinning of the vaginal walls. This may cause painful sex. Dryness of the skin and development of wrinkles. Headaches. Problems sleeping (insomnia). Mood swings or irritability. Memory problems. Weight gain. Hair growth on the face and chest. Bladder infections or problems with urinating. How is this diagnosed? This condition is diagnosed based on your medical history, a physical exam, your age, your menstrual history, and your symptoms. Hormone tests may also be done. How is this treated? In some cases, no treatment is needed. You and your health care provider should make a decision together about whether treatment is necessary. Treatment will be based on your individual condition and preferences. Treatment for this condition focuses on managing symptoms. Treatment may include: Menopausal hormone therapy (MHT). Medicines to treat specific symptoms or complications. Acupuncture. Vitamin or herbal supplements. Before starting treatment, make sure to let your health care provider know if you have a personal or family history of these conditions: Heart  disease. Breast cancer. Blood clots. Diabetes. Osteoporosis. Follow these instructions at home: Lifestyle Do not use any products that contain nicotine or tobacco, such as cigarettes, e-cigarettes, and chewing tobacco. If you need help quitting, ask your health care provider. Get at least 30 minutes of physical activity on 5 or more days each week. Avoid alcoholic and caffeinated beverages, as well as spicy foods. This may help prevent hot flashes. Get 7-8 hours of sleep each night. If you have hot flashes, try: Dressing in layers. Avoiding things that may trigger hot flashes, such as spicy food, warm places, or stress. Taking slow, deep breaths when a hot flash starts. Keeping a fan in your home and office. Find ways to manage stress, such as deep breathing, meditation, or journaling. Consider going to group therapy with other women who are having menopause symptoms. Ask your health care provider about recommended group therapy meetings. Eating and drinking  Eat a healthy, balanced diet that contains whole grains, lean protein, low-fat dairy, and plenty of fruits and vegetables. Your health care provider may recommend adding more soy to your diet. Foods that contain soy include tofu, tempeh, and soy milk. Eat plenty of foods that contain calcium and vitamin D for bone health. Items that are rich in calcium include low-fat milk, yogurt, beans, almonds, sardines, broccoli, and kale. Medicines Take over-the-counter and prescription medicines only as told by your health care provider. Talk with your health care provider before starting any herbal supplements. If prescribed, take vitamins and supplements as told by your health care provider. General instructions  Keep track of your menstrual periods, including: When they occur. How heavy they are and how long they last. How much time passes  between periods. Keep track of your symptoms, noting when they start, how often you have them, and  how long they last. Use vaginal lubricants or moisturizers to help with vaginal dryness and improve comfort during sex. Keep all follow-up visits. This is important. This includes any group therapy or counseling. Contact a health care provider if: You are still having menstrual periods after age 16. You have pain during sex. You have not had a period for 12 months and you develop vaginal bleeding. Get help right away if you have: Severe depression. Excessive vaginal bleeding. Pain when you urinate. A fast or irregular heartbeat (palpitations). Severe headaches. Abdominal pain or severe indigestion. Summary Menopause is a normal time of life when menstrual periods stop completely. It is usually defined as the absence of a menstrual period for 12 months without another medical cause. The transition to menopause (perimenopause) most often happens between the ages of 56 and 15 and can last for several years. Symptoms can be managed through medicines, lifestyle changes, and complementary therapies such as acupuncture. Eat a balanced diet that is rich in nutrients to promote bone health and heart health and to manage symptoms during menopause. This information is not intended to replace advice given to you by your health care provider. Make sure you discuss any questions you have with your health care provider. Document Revised: 11/23/2019 Document Reviewed: 08/09/2019 Elsevier Patient Education  [place].

## 2021-01-12 ENCOUNTER — Other Ambulatory Visit: Payer: BC Managed Care – PPO

## 2021-01-12 NOTE — Addendum Note (Signed)
Addended by: Rod Can on: 01/12/2021 11:47 AM   Modules accepted: Orders

## 2021-01-13 ENCOUNTER — Other Ambulatory Visit: Payer: Self-pay

## 2021-01-13 ENCOUNTER — Ambulatory Visit
Admission: RE | Admit: 2021-01-13 | Discharge: 2021-01-13 | Disposition: A | Discharge status: Home / Self Care | Payer: BC Managed Care – PPO | Source: Ambulatory Visit | Attending: Advanced Practice Midwife | Admitting: Advanced Practice Midwife

## 2021-01-13 DIAGNOSIS — N950 Postmenopausal bleeding: Secondary | ICD-10-CM | POA: Diagnosis present

## 2021-01-16 ENCOUNTER — Ambulatory Visit: Payer: BC Managed Care – PPO | Admitting: Obstetrics & Gynecology

## 2021-01-16 ENCOUNTER — Other Ambulatory Visit (HOSPITAL_COMMUNITY)
Admission: RE | Admit: 2021-01-16 | Discharge: 2021-01-16 | Disposition: A | Discharge status: Home / Self Care | Payer: BC Managed Care – PPO | Source: Ambulatory Visit | Attending: Obstetrics & Gynecology | Admitting: Obstetrics & Gynecology

## 2021-01-16 ENCOUNTER — Other Ambulatory Visit: Payer: Self-pay

## 2021-01-16 ENCOUNTER — Encounter: Payer: Self-pay | Admitting: Obstetrics & Gynecology

## 2021-01-16 VITALS — BP 120/80 | Ht 65.0 in | Wt 221.0 lb

## 2021-01-16 DIAGNOSIS — N950 Postmenopausal bleeding: Secondary | ICD-10-CM | POA: Diagnosis not present

## 2021-01-16 NOTE — Patient Instructions (Signed)

## 2021-01-16 NOTE — Progress Notes (Signed)
Postmenopausal Bleeding Patient is a 50 yo G4P3 complains of vaginal bleeding. She has been menopausal for 2 years. Currently on no HRT and blood thinner medicine or recent change in meds/activity. Bleeding is described as flow about like a period and has occurred 1 times. Other menopausal symptoms include: none. Workup to date: pelvic ultrasound.  US shows ES 9 mm and fibroids 1 cm x2 that have been there on prior US.  PMHx: She  has a past medical history of Anemia, Arthritis, Ectopic pregnancy, GERD (gastroesophageal reflux disease), Hyperlipidemia, Hypertension, Vertigo, and Vitamin D deficiency. Also,  has a past surgical history that includes Tubal ligation; Polypectomy (03/17/2012); Salpingectomy (Left); tubal reversal; Hysteroscopy (03/17/2012); Knee arthroscopy with meniscal repair (Right, 06/04/2019); and Colonoscopy with propofol (N/A, 06/16/2020)., family history includes Colon cancer in her father; Hyperlipidemia in her mother; Hypertension in her maternal grandmother and mother; Kidney Stones in her mother; Liver cancer in her father; Prostate cancer in her father.,  reports that she has never smoked. She has never used smokeless tobacco. She reports current alcohol use. She reports that she does not use drugs.  She has a current medication list which includes the following prescription(s): cyclobenzaprine, hydrochlorothiazide, meclizine, multivitamin with minerals, potassium chloride, vitamin d (ergocalciferol), and iron polysaccharides. Also, is allergic to grapefruit extract and aspirin.  Review of Systems  Constitutional:  Negative for chills, fever and malaise/fatigue.  HENT:  Negative for congestion, sinus pain and sore throat.   Eyes:  Negative for blurred vision and pain.  Respiratory:  Negative for cough and wheezing.   Cardiovascular:  Negative for chest pain and leg swelling.  Gastrointestinal:  Negative for abdominal pain, constipation, diarrhea, heartburn, nausea and  vomiting.  Genitourinary:  Negative for dysuria, frequency, hematuria and urgency.  Musculoskeletal:  Negative for back pain, joint pain, myalgias and neck pain.  Skin:  Negative for itching and rash.  Neurological:  Negative for dizziness, tremors and weakness.  Endo/Heme/Allergies:  Does not bruise/bleed easily.  Psychiatric/Behavioral:  Negative for depression. The patient is not nervous/anxious and does not have insomnia.    Objective: BP 120/80   Ht 5\' 5"  (1.651 m)   Wt 221 lb (100.2 kg)   LMP 01/08/2021 (Exact Date)   BMI 36.78 kg/m  Physical Exam Constitutional:      General: She is not in acute distress.    Appearance: She is well-developed.  Genitourinary:     Right Labia: No rash or tenderness.    Left Labia: No tenderness or rash.    No vaginal erythema or bleeding.      Right Adnexa: not tender and no mass present.    Left Adnexa: not tender and no mass present.    No cervical motion tenderness, discharge, polyp or nabothian cyst.     Uterus is not enlarged.     No uterine mass detected.    Pelvic exam was performed with patient in the lithotomy position.  HENT:     Head: Normocephalic and atraumatic.     Nose: Nose normal.  Abdominal:     General: There is no distension.     Palpations: Abdomen is soft.     Tenderness: There is no abdominal tenderness.  Musculoskeletal:        General: Normal range of motion.  Neurological:     Mental Status: She is alert and oriented to person, place, and time.     Cranial Nerves: No cranial nerve deficit.  Skin:    General: Skin is warm  and dry.  Psychiatric:        Attention and Perception: Attention normal.        Mood and Affect: Mood and affect normal.        Speech: Speech normal.        Behavior: Behavior normal.        Thought Content: Thought content normal.        Judgment: Judgment normal.   EXAM: TRANSABDOMINAL AND TRANSVAGINAL ULTRASOUND OF PELVIS   TECHNIQUE: Both transabdominal and transvaginal  ultrasound examinations of the pelvis were performed. Transabdominal technique was performed for global imaging of the pelvis including uterus, ovaries, adnexal regions, and pelvic cul-de-sac. It was necessary to proceed with endovaginal exam following the transabdominal exam to visualize the uterus/endometrium and ovaries/adnexa.   COMPARISON:  Ultrasound 11/25/2017   FINDINGS: Uterus   Measurements: 8.3 x 4.4 x 5.4 cm = volume: 104.1 mL. There is an intramural posterior right-sided uterine fibroid which measures 1.3 x 1.2 x 0.9 cm. There is an intramural fundal uterine fibroid measuring 1.0 x 0.8 x 0.6 cm.   Endometrium   Thickness: 9.4 mm. This is considered thickened in the postmenopausal state.   Right ovary   Measurements: 2.3 x 1.2 x 2.0 cm = volume: 2.9 mL. Normal appearance/no adnexal mass.   Left ovary   Measurements: 2.7 x 1.7 x 2.8 cm = volume: 6.7 mL. Normal appearance/no adnexal mass.   Other findings   No abnormal free fluid.   IMPRESSION: Endometrial thickness measures 9.4 mm. This is considered abnormal if postmenopausal. Recommend follow-up with OBGYN and consideration of endometrial tissue sampling.   There are 2 intramural uterine fibroids measuring up to 1.3 cm and 1.0 cm.     Electronically Signed   By: Maurine Simmering M.D.   On: 01/13/2021 16:20  ASSESSMENT/PLAN:   Problem List Items Addressed This Visit     Post-menopausal bleeding    -  Primary   Relevant Orders   Surgical pathology     EMB today Surgery discussed if positive for hyperplasia or cancer Otherwise wait and see what pattern develops for bleeding, if any  Endometrial Biopsy After discussion with the patient regarding her abnormal uterine bleeding I recommended that she proceed with an endometrial biopsy for further diagnosis. The risks, benefits, alternatives, and indications for an endometrial biopsy were discussed with the patient in detail. She understood the risks  including infection, bleeding, cervical laceration and uterine perforation.  Verbal consent was obtained.   PROCEDURE NOTE:  [place] endometrial biopsy was performed using aseptic technique with iodine preparation.  The uterus was sounded to a length of 7 cm.  Adequate sampling was obtained with minimal blood loss.  The patient tolerated the procedure well.  Disposition will be pending pathology.  Barnett Applebaum, MD, [facility], [facility] 01/16/2021  4:44 PM

## 2021-01-20 ENCOUNTER — Telehealth: Payer: Self-pay

## 2021-01-20 LAB — SURGICAL PATHOLOGY

## 2021-01-20 NOTE — Telephone Encounter (Signed)
Pt calling for bx results.  [phone]  Pt aware results are not back yet; usually takes 1 to 1 1/2 wks before we get them back.

## 2021-03-04 ENCOUNTER — Encounter: Payer: Self-pay | Admitting: Advanced Practice Midwife

## 2021-03-04 ENCOUNTER — Ambulatory Visit (INDEPENDENT_AMBULATORY_CARE_PROVIDER_SITE_OTHER): Payer: BC Managed Care – PPO | Admitting: Advanced Practice Midwife

## 2021-03-04 ENCOUNTER — Other Ambulatory Visit (HOSPITAL_COMMUNITY)
Admission: RE | Admit: 2021-03-04 | Discharge: 2021-03-04 | Disposition: A | Discharge status: Home / Self Care | Payer: BC Managed Care – PPO | Source: Ambulatory Visit | Attending: Advanced Practice Midwife | Admitting: Advanced Practice Midwife

## 2021-03-04 ENCOUNTER — Other Ambulatory Visit: Payer: Self-pay

## 2021-03-04 VITALS — BP 120/80 | Ht 64.0 in | Wt 214.0 lb

## 2021-03-04 DIAGNOSIS — Z1239 Encounter for other screening for malignant neoplasm of breast: Secondary | ICD-10-CM

## 2021-03-04 DIAGNOSIS — N898 Other specified noninflammatory disorders of vagina: Secondary | ICD-10-CM | POA: Diagnosis not present

## 2021-03-04 DIAGNOSIS — Z01419 Encounter for gynecological examination (general) (routine) without abnormal findings: Secondary | ICD-10-CM | POA: Insufficient documentation

## 2021-03-04 DIAGNOSIS — R638 Other symptoms and signs concerning food and fluid intake: Secondary | ICD-10-CM

## 2021-03-04 DIAGNOSIS — N951 Menopausal and female climacteric states: Secondary | ICD-10-CM

## 2021-03-04 MED ORDER — ESTRADIOL 0.0375 MG/24HR TD PTWK: 0.0375 mg | MEDICATED_PATCH | TRANSDERMAL | 11 refills | Status: DC

## 2021-03-05 ENCOUNTER — Other Ambulatory Visit: Payer: Self-pay | Admitting: Advanced Practice Midwife

## 2021-03-05 ENCOUNTER — Encounter: Payer: Self-pay | Admitting: Advanced Practice Midwife

## 2021-03-05 DIAGNOSIS — N760 Acute vaginitis: Secondary | ICD-10-CM

## 2021-03-05 DIAGNOSIS — B3731 Acute candidiasis of vulva and vagina: Secondary | ICD-10-CM

## 2021-03-05 LAB — PROGESTERONE: Progesterone: <0.1 ng/mL

## 2021-03-05 LAB — CERVICOVAGINAL ANCILLARY ONLY
Bacterial Vaginitis (gardnerella): POSITIVE — AB
Candida Glabrata: NEGATIVE
Candida Vaginitis: NEGATIVE
Comment: NEGATIVE
Comment: NEGATIVE
Comment: NEGATIVE

## 2021-03-05 LAB — ESTRADIOL: Estradiol: <5 pg/mL

## 2021-03-05 LAB — FSH/LH
FSH: 60.3 m[IU]/mL
LH: 24.8 m[IU]/mL

## 2021-03-05 MED ORDER — FLUCONAZOLE 150 MG PO TABS: 150.0000 mg | ORAL_TABLET | Freq: Once | ORAL | 1 refills | Status: AC

## 2021-03-05 MED ORDER — METRONIDAZOLE 500 MG PO TABS: 500.0000 mg | ORAL_TABLET | Freq: Two times a day (BID) | ORAL | 0 refills | Status: AC

## 2021-03-05 NOTE — Patient Instructions (Signed)
Mediterranean Diet °A Mediterranean diet refers to food and lifestyle choices that are based on the traditions of countries located on the [place]. It focuses on eating more fruits, vegetables, whole grains, beans, nuts, seeds, and heart-healthy fats, and eating less dairy, meat, eggs, and processed foods with added sugar, salt, and fat. This way of eating has been shown to help prevent certain conditions and improve outcomes for people who have chronic diseases, like kidney disease and heart disease. °What are tips for following this plan? °Reading food labels °Check the serving size of packaged foods. For foods such as rice and pasta, the serving size refers to the amount of cooked product, not dry. °Check the total fat in packaged foods. Avoid foods that have saturated fat or trans fats. °Check the ingredient list for added sugars, such as corn syrup. °Shopping ° °Buy a variety of foods that offer a balanced diet, including: °Fresh fruits and vegetables (produce). °Grains, beans, nuts, and seeds. Some of these may be available in unpackaged forms or large amounts (in bulk). °Fresh seafood. °Poultry and eggs. °Low-fat dairy products. °Buy whole ingredients instead of prepackaged foods. °Buy fresh fruits and vegetables in-season from local farmers markets. °Buy plain frozen fruits and vegetables. °If you do not have access to quality fresh seafood, buy precooked frozen shrimp or canned fish, such as tuna, salmon, or sardines. °Stock your pantry so you always have certain foods on hand, such as olive oil, canned tuna, canned tomatoes, rice, pasta, and beans. °Cooking °Cook foods with extra-virgin olive oil instead of using butter or other vegetable oils. °Have meat as a side dish, and have vegetables or grains as your main dish. This means having meat in small portions or adding small amounts of meat to foods like pasta or stew. °Use beans or vegetables instead of meat in common dishes like chili or  lasagna. °Experiment with different cooking methods. Try roasting, broiling, steaming, and sautéing vegetables. °Add frozen vegetables to soups, stews, pasta, or rice. °Add nuts or seeds for added healthy fats and plant protein at each meal. You can add these to yogurt, salads, or vegetable dishes. °Marinate fish or vegetables using olive oil, lemon juice, garlic, and fresh herbs. °Meal planning °Plan to eat one vegetarian meal one day each week. Try to work up to two vegetarian meals, if possible. °Eat seafood two or more times a week. °Have healthy snacks readily available, such as: °Vegetable sticks with hummus. °Greek yogurt. °Fruit and nut trail mix. °Eat balanced meals throughout the week. This includes: °Fruit: 2-3 servings a day. °Vegetables: 4-5 servings a day. °Low-fat dairy: 2 servings a day. °Fish, poultry, or lean meat: 1 serving a day. °Beans and legumes: 2 or more servings a week. °Nuts and seeds: 1-2 servings a day. °Whole grains: 6-8 servings a day. °Extra-virgin olive oil: 3-4 servings a day. °Limit red meat and sweets to only a few servings a month. °Lifestyle ° °Cook and eat meals together with your family, when possible. °Drink enough fluid to keep your urine pale yellow. °Be physically active every day. This includes: °Aerobic exercise like running or swimming. °Leisure activities like gardening, walking, or housework. °Get 7-8 hours of sleep each night. °If recommended by your health care provider, drink red wine in moderation. This means 1 glass a day for nonpregnant women and 2 glasses a day for men. A glass of wine equals 5 oz (150 mL). °What foods should I eat? °Fruits °Apples. Apricots. Avocado. Berries. Bananas. Cherries. Dates.   Figs. Grapes. Lemons. Melon. Oranges. Peaches. Plums. Pomegranate. Vegetables Artichokes. Beets. Broccoli. Cabbage. Carrots. Eggplant. Green beans. Chard. Kale. Spinach. Onions. Leeks. Peas. Squash. Tomatoes. Peppers. Radishes. Grains Whole-grain pasta. Brown  rice. Bulgur wheat. Polenta. Couscous. Whole-wheat bread. [place]. Meats and other proteins Beans. Almonds. Sunflower seeds. Pine nuts. Peanuts. [place]. Salmon. Scallops. Shrimp. [place]. Tilapia. Clams. Oysters. Eggs. Poultry without skin. Dairy Low-fat milk. Cheese. Greek yogurt. Fats and oils Extra-virgin olive oil. Avocado oil. Grapeseed oil. Beverages Water. Red wine. Herbal tea. Sweets and desserts Greek yogurt with honey. Baked apples. Poached pears. Trail mix. Seasonings and condiments Basil. Cilantro. Coriander. Cumin. Mint. Parsley. Sage. Rosemary. Tarragon. Garlic. Oregano. Thyme. Pepper. Balsamic vinegar. Tahini. Hummus. Tomato sauce. Olives. Mushrooms. The items listed above may not be a complete list of foods and beverages you can eat. Contact a dietitian for more information. What foods should I limit? This is a list of foods that should be eaten rarely or only on special occasions. Fruits Fruit canned in syrup. Vegetables Deep-fried potatoes (french fries). Grains Prepackaged pasta or rice dishes. Prepackaged cereal with added sugar. Prepackaged snacks with added sugar. Meats and other proteins Beef. Pork. Lamb. Poultry with skin. Hot dogs. Bernie[place] Salines. Dairy Ice cream. Sour cream. Whole milk. Fats and oils Butter. Canola oil. Vegetable oil. Beef fat (tallow). Lard. Beverages Juice. Sugar-sweetened soft drinks. Beer. Liquor and spirits. Sweets and desserts Cookies. Cakes. Pies. Candy. Seasonings and condiments Mayonnaise. Pre-made sauces and marinades. The items listed above may not be a complete list of foods and beverages you should limit. Contact a dietitian for more information. Summary The Mediterranean diet includes both food and lifestyle choices. Eat a variety of fresh fruits and vegetables, beans, nuts, seeds, and whole grains. Limit the amount of red meat and sweets that you eat. If recommended by your health care provider, drink red wine in moderation.  This means 1 glass a day for nonpregnant women and 2 glasses a day for men. A glass of wine equals 5 oz (150 mL). This information is not intended to replace advice given to you by your health care provider. Make sure you discuss any questions you have with your health care provider. Document Revised: 03/30/2019 Document Reviewed: 01/25/2019 Elsevier Patient Education  2022 [place] Maintenance, Female Adopting a healthy lifestyle and getting preventive care are important in promoting health and wellness. Ask your health care provider about: The right schedule for you to have regular tests and exams. Things you can do on your own to prevent diseases and keep yourself healthy. What should I know about diet, weight, and exercise? Eat a healthy diet  Eat a diet that includes plenty of vegetables, fruits, low-fat dairy products, and lean protein. Do not eat a lot of foods that are high in solid fats, added sugars, or sodium. Maintain a healthy weight Body mass index (BMI) is used to identify weight problems. It estimates body fat based on height and weight. Your health care provider can help determine your BMI and help you achieve or maintain a healthy weight. Get regular exercise Get regular exercise. This is one of the most important things you can do for your health. Most adults should: Exercise for at least 150 minutes each week. The exercise should increase your heart rate and make you sweat (moderate-intensity exercise). Do strengthening exercises at least twice a week. This is in addition to the moderate-intensity exercise. Spend less time sitting. Even light physical activity can be beneficial. Watch cholesterol and blood lipids Have  your blood tested for lipids and cholesterol at 51 years of age, then have this test every 5 years. Have your cholesterol levels checked more often if: Your lipid or cholesterol levels are high. You are older than 51 years of age. You are at  high risk for heart disease. What should I know about cancer screening? Depending on your health history and family history, you may need to have cancer screening at various ages. This may include screening for: Breast cancer. Cervical cancer. Colorectal cancer. Skin cancer. Lung cancer. What should I know about heart disease, diabetes, and high blood pressure? Blood pressure and heart disease High blood pressure causes heart disease and increases the risk of stroke. This is more likely to develop in people who have high blood pressure readings or are overweight. Have your blood pressure checked: Every 3-5 years if you are 59-70 years of age. Every year if you are 48 years old or older. Diabetes Have regular diabetes screenings. This checks your fasting blood sugar level. Have the screening done: Once every three years after age 58 if you are at a normal weight and have a low risk for diabetes. More often and at a younger age if you are overweight or have a high risk for diabetes. What should I know about preventing infection? Hepatitis B If you have a higher risk for hepatitis B, you should be screened for this virus. Talk with your health care provider to find out if you are at risk for hepatitis B infection. Hepatitis C Testing is recommended for: Everyone born from [birth year] through 1966. Anyone with known risk factors for hepatitis C. Sexually transmitted infections (STIs) Get screened for STIs, including gonorrhea and chlamydia, if: You are sexually active and are younger than 51 years of age. You are older than 51 years of age and your health care provider tells you that you are at risk for this type of infection. Your sexual activity has changed since you were last screened, and you are at increased risk for chlamydia or gonorrhea. Ask your health care provider if you are at risk. Ask your health care provider about whether you are at high risk for HIV. Your health care provider may  recommend a prescription medicine to help prevent HIV infection. If you choose to take medicine to prevent HIV, you should first get tested for HIV. You should then be tested every 3 months for as long as you are taking the medicine. Pregnancy If you are about to stop having your period (premenopausal) and you may become pregnant, seek counseling before you get pregnant. Take 400 to 800 micrograms (mcg) of folic acid every day if you become pregnant. Ask for birth control (contraception) if you want to prevent pregnancy. Osteoporosis and menopause Osteoporosis is a disease in which the bones lose minerals and strength with aging. This can result in bone fractures. If you are 12 years old or older, or if you are at risk for osteoporosis and fractures, ask your health care provider if you should: Be screened for bone loss. Take a calcium or vitamin D supplement to lower your risk of fractures. Be given hormone replacement therapy (HRT) to treat symptoms of menopause. Follow these instructions at home: Alcohol use Do not drink alcohol if: Your health care provider tells you not to drink. You are pregnant, may be pregnant, or are planning to become pregnant. If you drink alcohol: Limit how much you have to: 0-1 drink a day. Know how much alcohol is  in your drink. In the [place] one drink equals one 12 oz bottle of beer (355 mL), one 5 oz glass of wine (148 mL), or one 1 oz glass of hard liquor (44 mL). Lifestyle Do not use any products that contain nicotine or tobacco. These products include cigarettes, chewing tobacco, and vaping devices, such as e-cigarettes. If you need help quitting, ask your health care provider. Do not use street drugs. Do not share needles. Ask your health care provider for help if you need support or information about quitting drugs. General instructions Schedule regular health, dental, and eye exams. Stay current with your vaccines. Tell your health care provider  if: You often feel depressed. You have ever been abused or do not feel safe at home. Summary Adopting a healthy lifestyle and getting preventive care are important in promoting health and wellness. Follow your health care provider's instructions about healthy diet, exercising, and getting tested or screened for diseases. Follow your health care provider's instructions on monitoring your cholesterol and blood pressure. This information is not intended to replace advice given to you by your health care provider. Make sure you discuss any questions you have with your health care provider. Document Revised: 07/14/2020 Document Reviewed: 07/14/2020 Elsevier Patient Education  [place].

## 2021-03-05 NOTE — Progress Notes (Signed)
Gynecology Annual Exam  Date of Service: 03/04/2021  PCP: Glendon Axe, MD  Chief Complaint:  Chief Complaint  Patient presents with   Annual Exam    History of Present Illness:Patient is a 50 y.o. [phone] presents for annual exam. The patient has complaints today of weight gain and a "twinge" of vaginal odor. She requests referral to [facility] for diet/nutrition counseling and vaginitis screen. She also mentions hot flashes that interrupt her daily routine. She accepts Rx for Estradiol patch. She would like to have her reproductive hormone levels checked given recent abnormal gyn ultrasound followed by normal endometrial biopsy.    LMP: Postmenopausal  Postcoital Bleeding: no Dysmenorrhea: not applicable  The patient is sexually active. She denies dyspareunia.  The patient does perform self breast exams.  There is no notable family history of breast or ovarian cancer in her family.  The patient wears seatbelts: yes.   The patient has regular exercise:  She has limited exercise due to knee limitations. She admits healthy diet and increased amounts of pasta. She drinks some water and also sweet tea. She usually has 5 hours of sleep .    The patient denies current symptoms of depression.     Review of Systems: Review of Systems  Constitutional:  Negative for chills and fever.  HENT:  Negative for congestion, ear discharge, ear pain, hearing loss, sinus pain and sore throat.   Eyes:  Negative for blurred vision and double vision.  Respiratory:  Negative for cough, shortness of breath and wheezing.   Cardiovascular:  Negative for chest pain, palpitations and leg swelling.  Gastrointestinal:  Negative for abdominal pain, blood in stool, constipation, diarrhea, heartburn, melena, nausea and vomiting.  Genitourinary:  Negative for dysuria, flank pain, frequency, hematuria and urgency.       Positive for vaginal odor  Musculoskeletal:  Positive for joint pain. Negative for back  pain and myalgias.  Skin:  Negative for itching and rash.  Neurological:  Negative for dizziness, tingling, tremors, sensory change, speech change, focal weakness, seizures, loss of consciousness, weakness and headaches.  Endo/Heme/Allergies:  Negative for environmental allergies. Does not bruise/bleed easily.  Psychiatric/Behavioral:  Negative for depression, hallucinations, memory loss, substance abuse and suicidal ideas. The patient is not nervous/anxious and does not have insomnia.    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/2018   Menorrhagia 11/01/2017   Pain in pelvis 11/01/2017   Polyp at cervical os 11/01/2017   Gastroesophageal reflux disease without esophagitis 09/29/2016   Intermittent vertigo 09/29/2016   Left hand tendonitis 09/29/2016   Essential hypertension 07/23/2016   Hyperlipidemia 07/23/2016   Vertigo 07/23/2016   Vitamin D deficiency 07/23/2016    Past Surgical History:  Past Surgical History:  Procedure Laterality Date   COLONOSCOPY WITH PROPOFOL N/A 06/16/2020   Procedure: COLONOSCOPY WITH PROPOFOL;  Surgeon: [name], MD;  Location: [facility];  Service: Endoscopy;  Laterality: N/A;  priority 4   HYSTEROSCOPY  03/17/2012   KNEE ARTHROSCOPY WITH MENISCAL REPAIR Right 06/04/2019   Procedure: RIGHT KNEE ARTHROSCOPY, MEDIAL MENISCUS ROOT REPAIR, CHONDROPLASTY, AND OPEN HIGH TIBIAL OSTEOTOMY;  Surgeon: Leim Fabry, MD;  Location: [facility];  Service: Orthopedics;  Laterality: Right;   POLYPECTOMY  03/17/2012   cervical  SALPINGECTOMY Left    for left ectopic   TUBAL LIGATION     done in 1994   tubal reversal      Gynecologic History:  Patient's last menstrual period was 01/08/2021 (exact date). Last Pap:  2 years ago Results were:  no abnormalities  Last mammogram: 4 months ago Results were: BI-RAD II benign  Obstetric History: [id number]  Family History:  Family History  Problem Relation Age of Onset   Hypertension Mother    Hyperlipidemia Mother    Kidney Stones Mother    Liver cancer Father        died in his 84s   Prostate cancer Father    Colon cancer Father    Hypertension Maternal Grandmother     Social History:  Social History   Socioeconomic History   Marital status: Married    Spouse name: thomas   Number of children: 3   Years of education: Not on file   Highest education level: Not on file  Occupational History   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  Tobacco Use   Smoking status: Never   Smokeless tobacco: Never  Vaping Use   Vaping Use: Never used  Substance and Sexual Activity   Alcohol use: Yes    Comment: occasional glass of wine   Drug use: No   Sexual activity: Yes    Birth control/protection: None  Other Topics Concern   Not on file  Social History Narrative   Patient lives with husband, 2 daughters and her mother   2 story house   Social Determinants of Health   Financial Resource Strain: Not on file  Food Insecurity: Not on file  Transportation Needs: Not on file  Physical Activity: Not on file  Stress: Not on file  Social Connections: Not on file  Intimate Partner Violence: Not on file    Allergies:  Allergies  Allergen Reactions   Grapefruit Extract Swelling    HIGH AMOUNTS OF CITRIC JUICES WILL CAUSE THIS   Aspirin Nausea And Vomiting    ALL ASPIRIN PRODUCTS    Medications: Prior to Admission medications   Medication Sig Start Date End Date Taking? Authorizing Provider  amLODipine (NORVASC) 5 MG tablet Take by mouth. 02/17/21 02/17/22 Yes [provider]  cyclobenzaprine (FLEXERIL) 5 MG tablet Take 1-2 tablets (5-10 mg total) by mouth 3 (three) times daily as needed for muscle spasms. 08/19/20  Yes  Duanne Guess, PA-C  estradiol (CLIMARA - DOSED IN MG/24 HR) 0.0375 mg/24hr patch Place 1 patch (0.0375 mg total) onto the skin once a week. 03/04/21  Yes Rod Can, CNM  hydrochlorothiazide (HYDRODIURIL) 12.5 MG tablet Take 1 tablet (12.5 mg total) by mouth daily. 07/23/16  Yes Dalia Heading, CNM  meclizine (ANTIVERT) 25 MG tablet meclizine 25 mg tablet  Take 1 tablet(s) 3 times a day by oral route with meals.   Yes [provider]  Multiple Vitamins-Minerals (MULTIVITAMIN WITH MINERALS) tablet Take 1 tablet by mouth daily.   Yes [provider]  omeprazole (PRILOSEC OTC) 20 MG tablet Take by mouth. 02/17/21  Yes [provider]  potassium chloride (KLOR-CON) 10 MEQ tablet Take by mouth. 09/07/19  Yes [provider]  Vitamin D, Ergocalciferol, (DRISDOL) 50000 units CAPS capsule Take 50,000 Units by mouth every 7 (seven) days. Has not had any of these to take for a while now.   Yes [provider]  iron polysaccharides (NIFEREX) 150 MG capsule Take by  mouth. 09/03/19 03/01/20  [provider]    Physical Exam Vitals: Blood pressure 120/80, height 5\' 4"  (1.626 m), weight 214 lb (97.1 kg), last menstrual period 01/08/2021.  General: NAD HEENT: normocephalic, anicteric Thyroid: no enlargement, no palpable nodules Pulmonary: No increased work of breathing, CTAB Cardiovascular: RRR, distal pulses 2+ Breast: Breast symmetrical, no tenderness, no palpable nodules or masses, no skin or nipple retraction present, no nipple discharge.  No axillary or supraclavicular lymphadenopathy. Abdomen: NABS, soft, non-tender, non-distended.  Umbilicus without lesions.  No hepatomegaly, splenomegaly or masses palpable. No evidence of hernia  Genitourinary:  External: Normal external female genitalia.  Normal urethral meatus, normal Bartholin's and Skene's glands.    Vagina: Normal vaginal mucosa, no evidence of prolapse.   Extremities: no edema,  erythema, or tenderness Neurologic: Grossly intact Psychiatric: mood appropriate, affect full   Update to visit:  Latest Reference Range & Units 03/04/21 11:28  LH mIU/mL 24.8  FSH mIU/mL 60.3  Estradiol pg/mL <5.0  Progesterone ng/mL <0.1      Assessment: 50 y.o. [id number] postmenopausal, routine annual exam  Plan: Problem List Items Addressed This Visit   None Visit Diagnoses     Well woman exam with routine gynecological exam    -  Primary   Relevant Medications   estradiol (CLIMARA - DOSED IN MG/24 HR) 0.0375 mg/24hr patch   Other Relevant Orders   Cervicovaginal ancillary only   Ambulatory referral to Nutrition and Diabetic Education   Progesterone (Completed)   Estradiol (Completed)   FSH/LH (Completed)   Vaginal odor       Relevant Orders   Cervicovaginal ancillary only   Unable to lose weight       Relevant Orders   Ambulatory referral to Nutrition and Diabetic Education   Vasomotor symptoms due to menopause       Relevant Medications   estradiol (CLIMARA - DOSED IN MG/24 HR) 0.0375 mg/24hr patch   Menopausal symptoms       Relevant Orders   Progesterone (Completed)   Estradiol (Completed)   FSH/LH (Completed)       1) Mammogram - recommend yearly screening mammogram.  Mammogram Was ordered today to be done next August or later  2) STI screening  was offered and declined  3) ASCCP guidelines and rationale discussed.  Patient opts for every 3 years screening interval  4) Osteoporosis  - per USPTF routine screening DEXA at age 26  Consider FDA-approved medical therapies in postmenopausal women and men aged 34 years and older, based on the following: a) A hip or vertebral (clinical or morphometric) fracture b) T-score ? -2.5 at the femoral neck or spine after appropriate evaluation to exclude secondary causes C) Low bone mass (T-score between -1.0 and -2.5 at the femoral neck or spine) and a 10-year probability of a hip fracture ? 3% or a 10-year  probability of a major osteoporosis-related fracture ? 20% based on the [place]-adapted WHO algorithm   5) Routine healthcare maintenance including cholesterol, diabetes screening discussed  she accepts hormone level testing  6) Colonoscopy up to date.  Screening recommended starting at age 53 for average risk individuals, age 30 for individuals deemed at increased risk (including African Americans) and recommended to continue until age 19.  For patient age 84-85 individualized approach is recommended.  Gold standard screening is via colonoscopy, Cologuard screening is an acceptable alternative for patient unwilling or unable to undergo colonoscopy.  "Colorectal cancer screening for average?risk adults: 2018 guideline update from the American  Cancer Society"[place]: A Cancer Journal for Clinicians: Aug 04, 2016   7) Increase healthy lifestyle: diet, hydration, sleep, exercise  8) Hot flashes: estradiol patch  9) Nutrition consult with [facility]  10) Return in about 1 year (around 03/04/2022) for annual established gyn.    Christean Leaf, CNM [facility] 03/05/21, 8:41 AM

## 2021-03-05 NOTE — Progress Notes (Signed)
Rx's sent to treat BV and if yeast develops. Message sent to patient.

## 2021-06-23 DIAGNOSIS — K219 Gastro-esophageal reflux disease without esophagitis: Secondary | ICD-10-CM | POA: Diagnosis not present

## 2021-06-23 DIAGNOSIS — Z0000 Encounter for general adult medical examination without abnormal findings: Secondary | ICD-10-CM | POA: Diagnosis not present

## 2021-06-23 DIAGNOSIS — E876 Hypokalemia: Secondary | ICD-10-CM | POA: Diagnosis not present

## 2021-06-23 DIAGNOSIS — I10 Essential (primary) hypertension: Secondary | ICD-10-CM | POA: Diagnosis not present

## 2021-06-30 DIAGNOSIS — E7800 Pure hypercholesterolemia, unspecified: Secondary | ICD-10-CM | POA: Diagnosis not present

## 2021-06-30 DIAGNOSIS — I10 Essential (primary) hypertension: Secondary | ICD-10-CM | POA: Diagnosis not present

## 2021-06-30 DIAGNOSIS — M5442 Lumbago with sciatica, left side: Secondary | ICD-10-CM | POA: Diagnosis not present

## 2021-06-30 DIAGNOSIS — R7309 Other abnormal glucose: Secondary | ICD-10-CM | POA: Diagnosis not present

## 2021-09-09 ENCOUNTER — Other Ambulatory Visit: Payer: Self-pay | Admitting: Physical Medicine & Rehabilitation

## 2021-09-09 DIAGNOSIS — G8929 Other chronic pain: Secondary | ICD-10-CM

## 2021-09-17 ENCOUNTER — Ambulatory Visit
Admission: RE | Admit: 2021-09-17 | Discharge: 2021-09-17 | Disposition: A | Discharge status: Home / Self Care | Payer: 59 | Source: Ambulatory Visit | Attending: Physical Medicine & Rehabilitation | Admitting: Physical Medicine & Rehabilitation

## 2021-09-17 DIAGNOSIS — G8929 Other chronic pain: Secondary | ICD-10-CM | POA: Insufficient documentation

## 2021-09-17 DIAGNOSIS — M5442 Lumbago with sciatica, left side: Secondary | ICD-10-CM | POA: Insufficient documentation

## 2021-12-02 ENCOUNTER — Ambulatory Visit
Admission: RE | Admit: 2021-12-02 | Discharge: 2021-12-02 | Disposition: A | Discharge status: Home / Self Care | Payer: 59 | Source: Ambulatory Visit | Attending: Advanced Practice Midwife | Admitting: Advanced Practice Midwife

## 2021-12-02 DIAGNOSIS — Z1231 Encounter for screening mammogram for malignant neoplasm of breast: Secondary | ICD-10-CM | POA: Insufficient documentation

## 2021-12-02 DIAGNOSIS — Z1239 Encounter for other screening for malignant neoplasm of breast: Secondary | ICD-10-CM

## 2022-04-03 IMAGING — MG MM DIGITAL SCREENING BILAT W/ TOMO AND CAD
6 of 10 series · 6 of 30 positions shown · non-contrast
Comparison: None.

CLINICAL DATA: Screening.

EXAM:
DIGITAL SCREENING BILATERAL MAMMOGRAM WITH TOMOSYNTHESIS AND CAD
TECHNIQUE: Bilateral screening digital craniocaudal and mediolateral oblique
mammograms were obtained. Bilateral screening digital breast
tomosynthesis was performed. The images were evaluated with
computer-aided detection.

[L CC synth-2D]
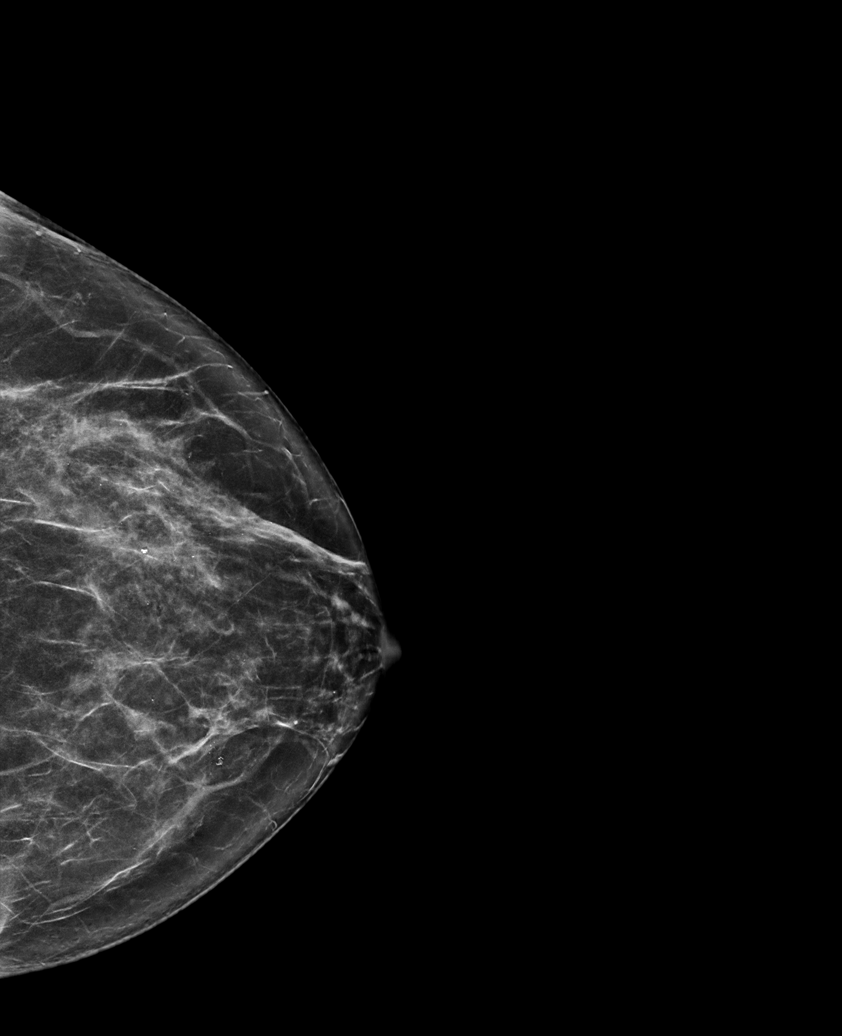

[L MLO synth-2D]
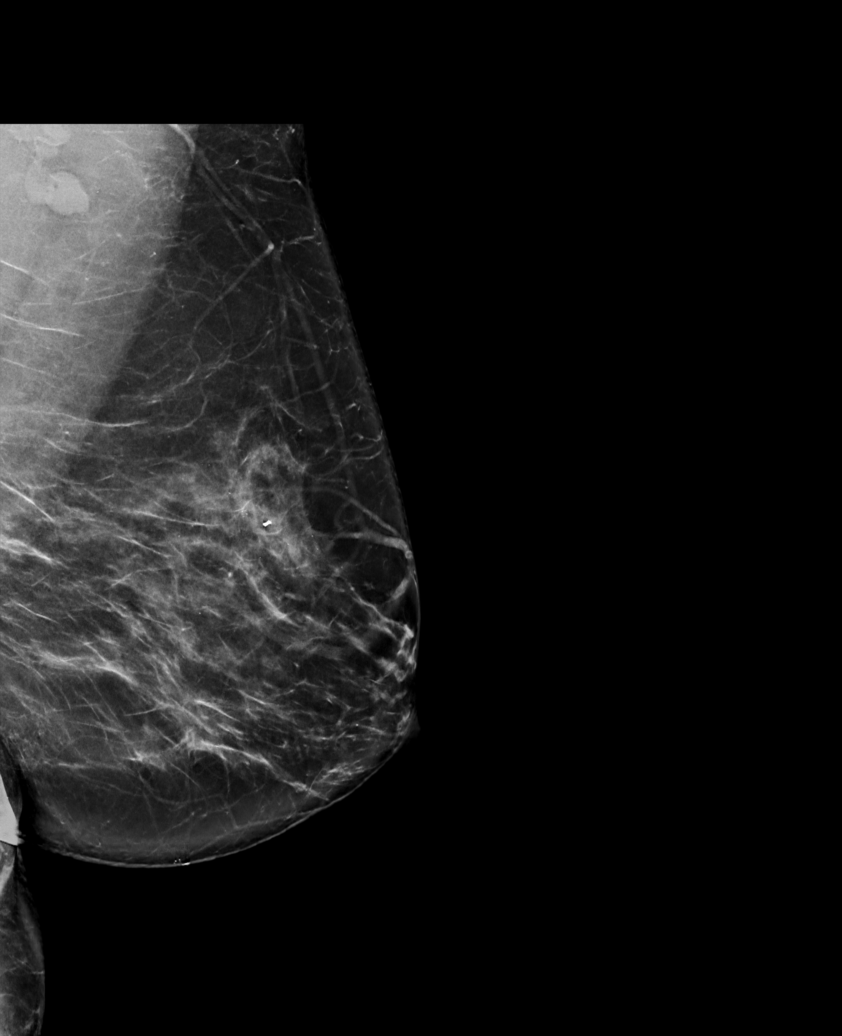

[R CC synth-2D]
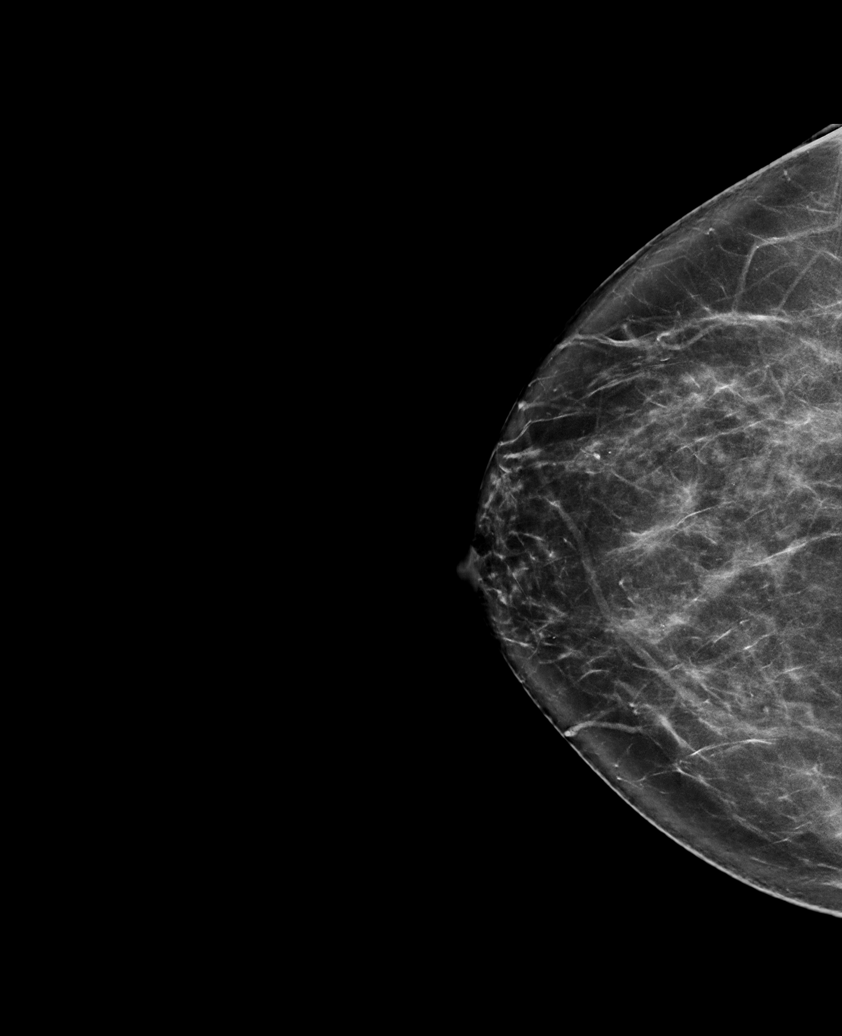

[R MLO synth-2D (1 of 2)]
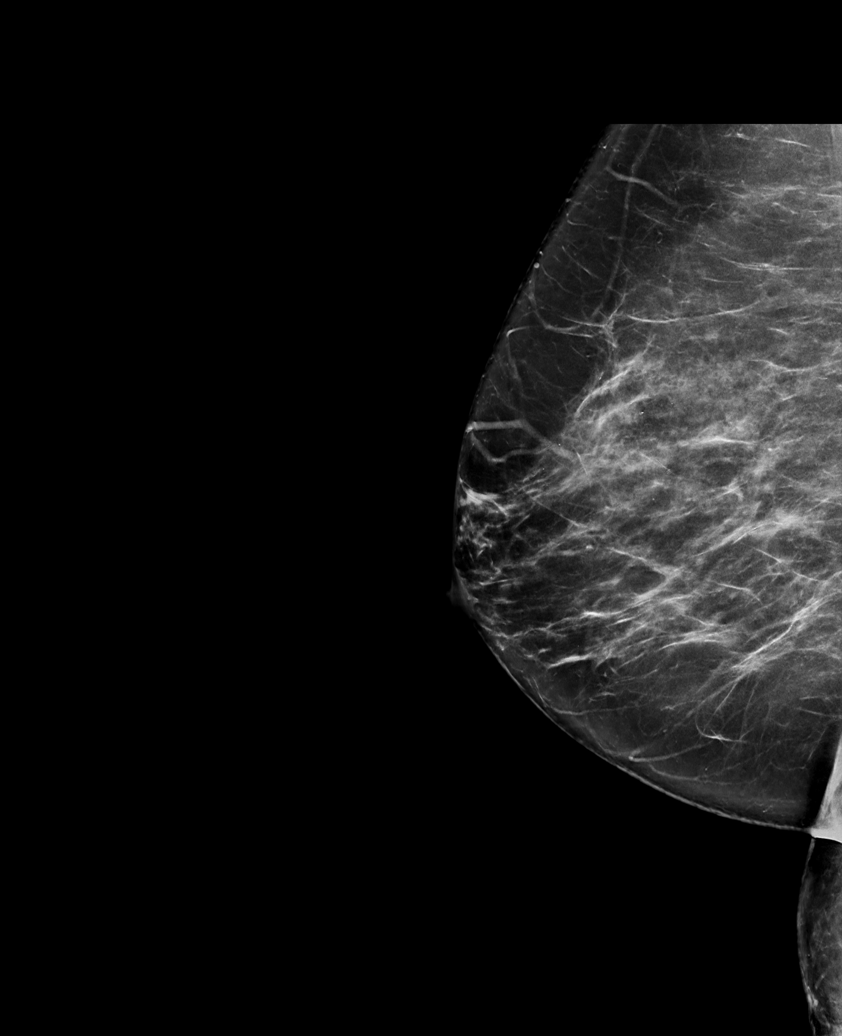

[R MLO synth-2D (2 of 2)]
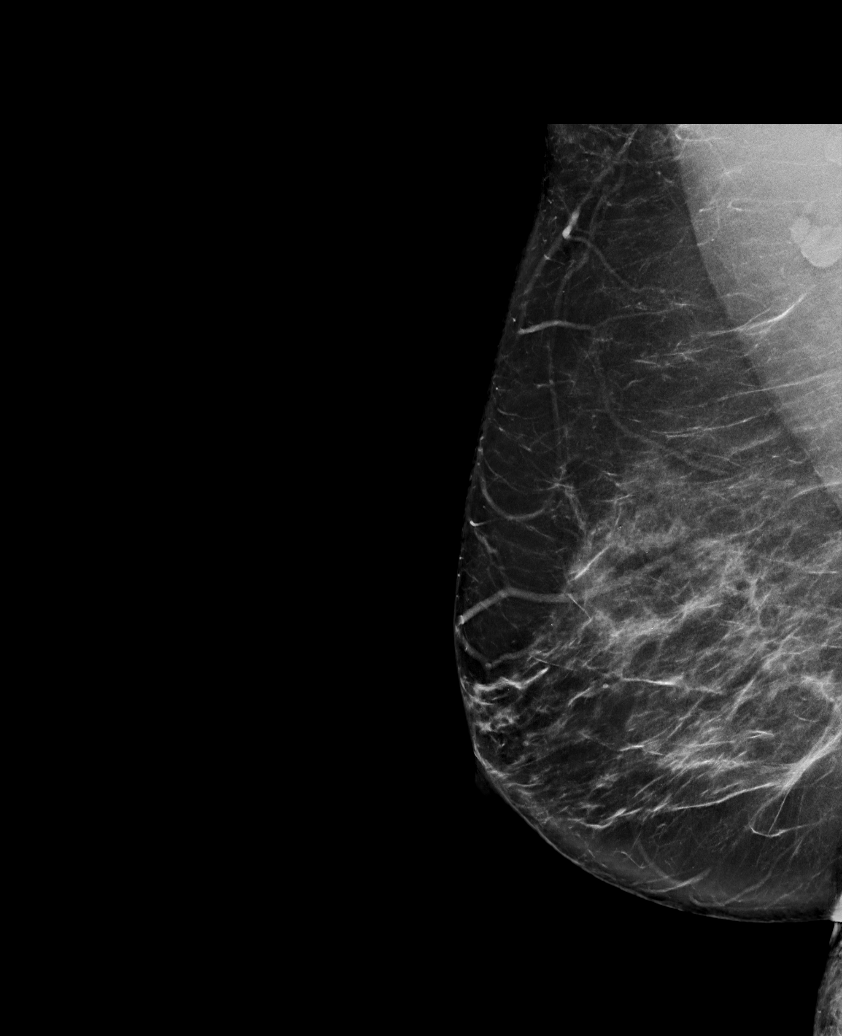

[R MLO tomo · tomo slice 45/89.0]
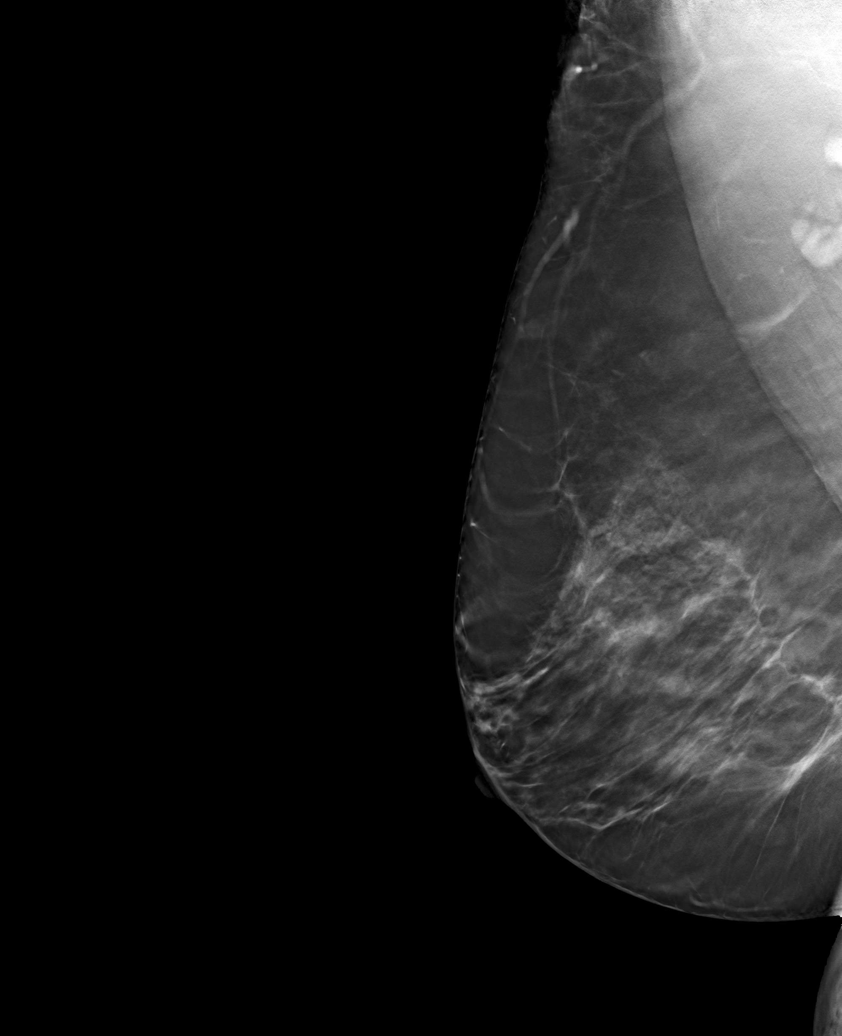

[6 of 30 positions shown; findings below may reference images not displayed]

ACR Breast Density Category c: The breast tissue is heterogeneously
dense, which may obscure small masses.
FINDINGS: In the left breast, a possible mass warrants further evaluation. In
the right breast, no findings suspicious for malignancy.
IMPRESSION: Further evaluation is suggested for a possible mass in the left
breast.

RECOMMENDATION:
Diagnostic mammogram and possibly ultrasound of the left breast.
(Code:O7-4-DDG)

The patient will be contacted regarding the findings, and additional
imaging will be scheduled.

BI-RADS CATEGORY  0: Incomplete. Need additional imaging evaluation
and/or prior mammograms for comparison.

## 2022-04-13 IMAGING — US US BREAST*L* LIMITED INC AXILLA
1 series · 9 of 9 positions shown · non-contrast
Comparison: Previous exams.

CLINICAL DATA: Screening recall for possible left breast mass.

EXAM:
DIGITAL DIAGNOSTIC UNILATERAL LEFT MAMMOGRAM WITH TOMOSYNTHESIS AND
CAD; ULTRASOUND LEFT BREAST LIMITED
TECHNIQUE: Left digital diagnostic mammography and breast tomosynthesis was
performed. The images were evaluated with computer-aided detection.;
Targeted ultrasound examination of the left breast was performed.

[Series 1: us breast*left* limited inc axilla · 0.07mm/px · 9 of 9 slices shown]
[im 1/9]
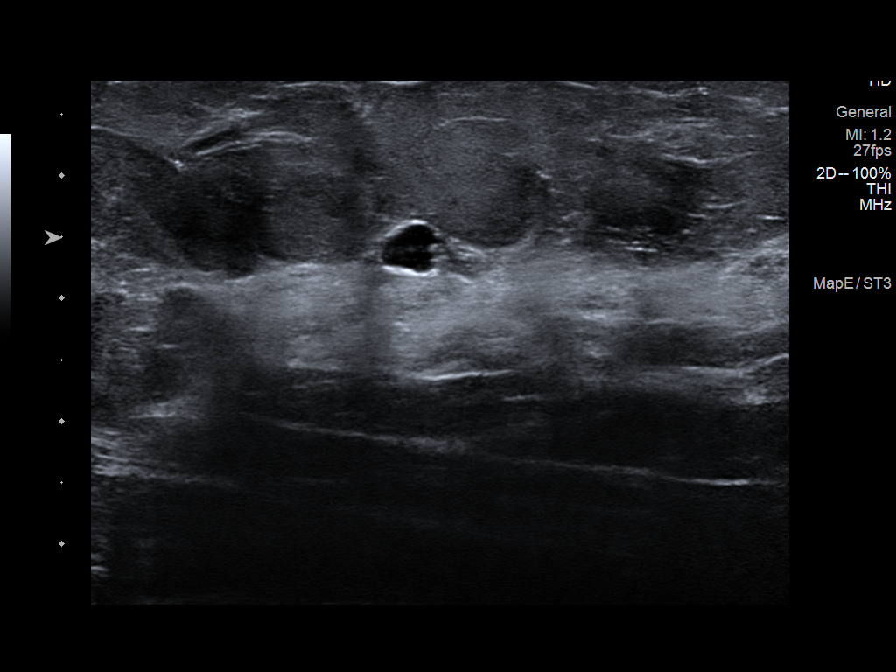
[im 2/9]
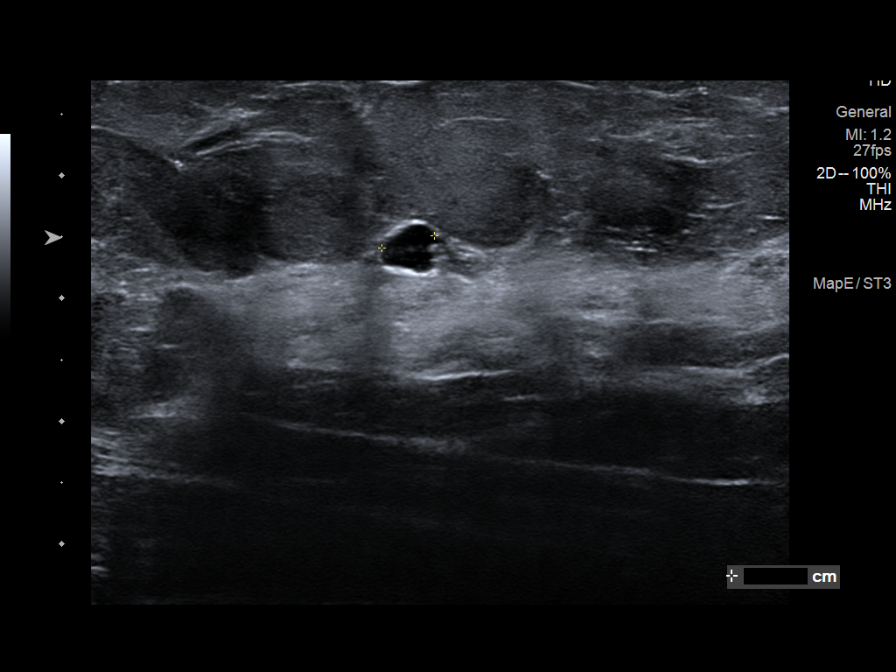
[im 3/9]
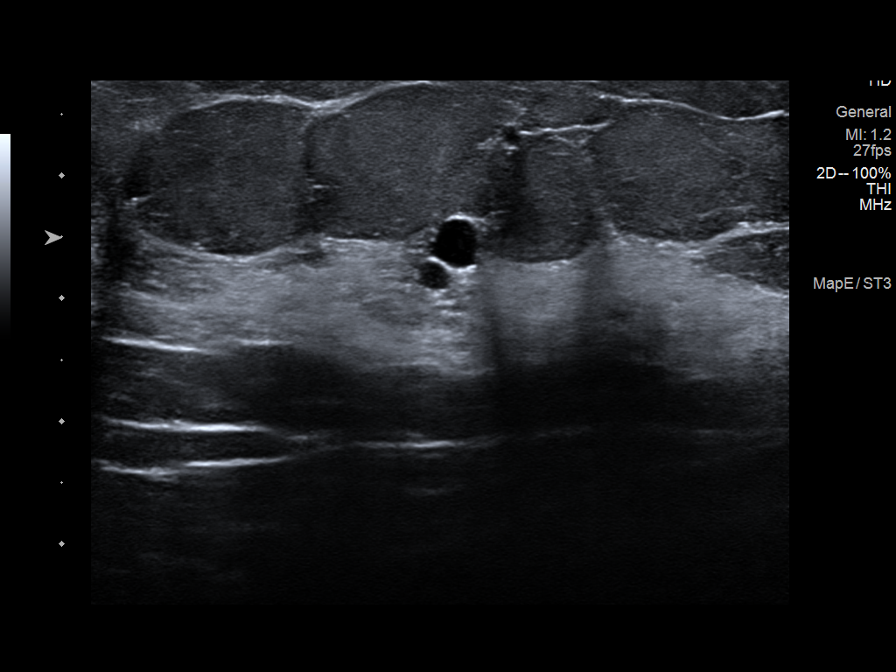
[im 4/9]
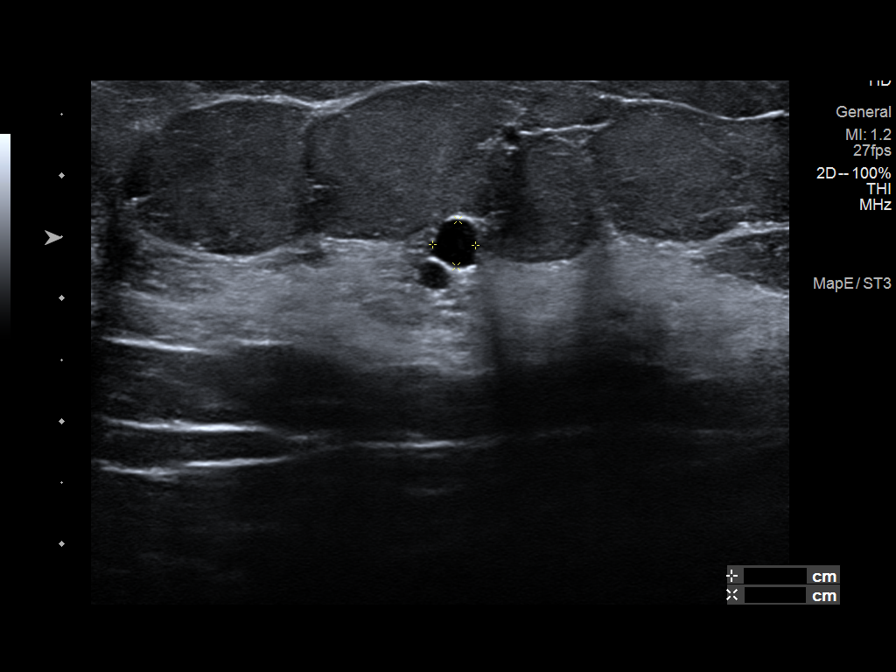
[im 5/9]
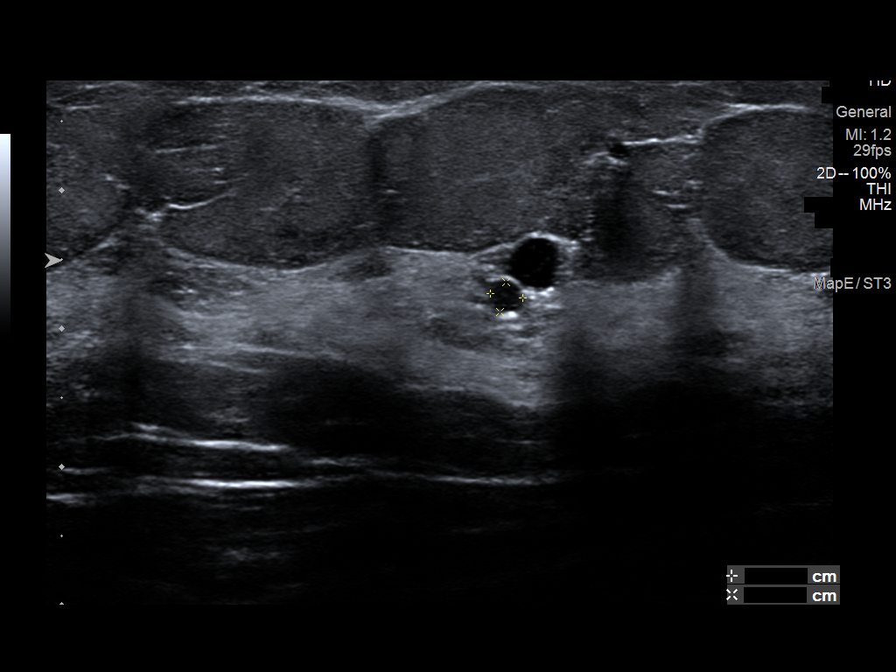
[im 6/9]
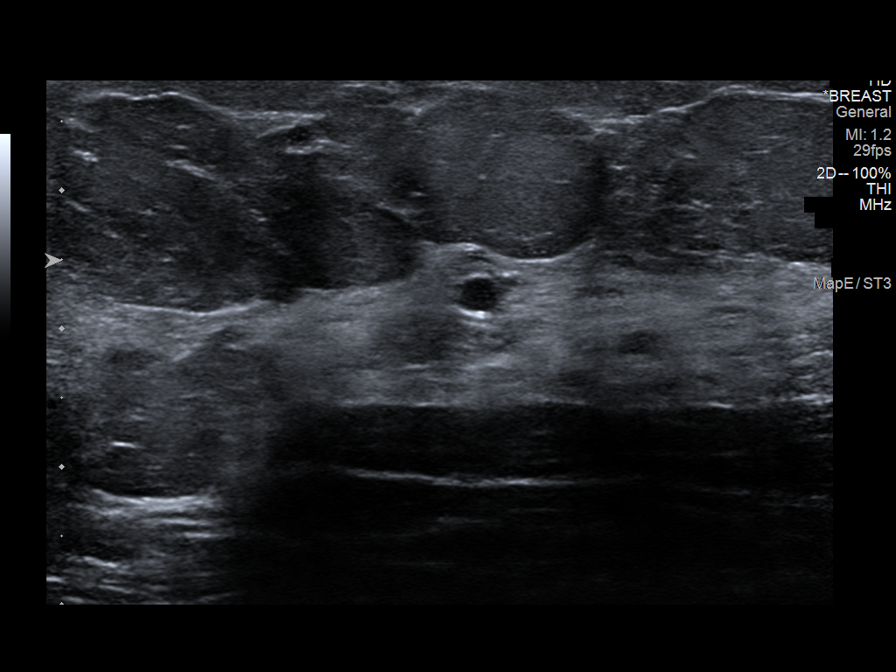
[im 7/9]
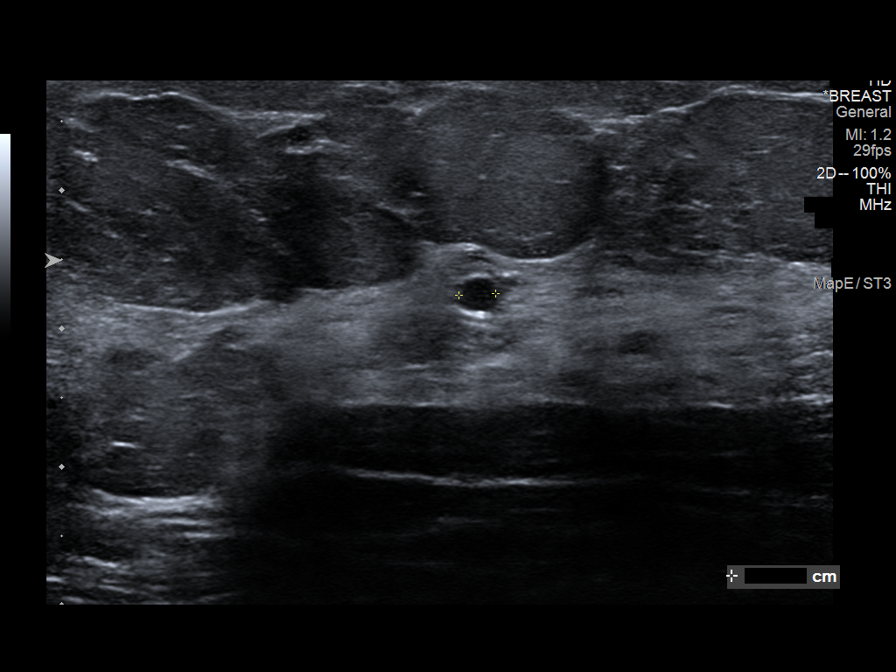
[im 8/9]
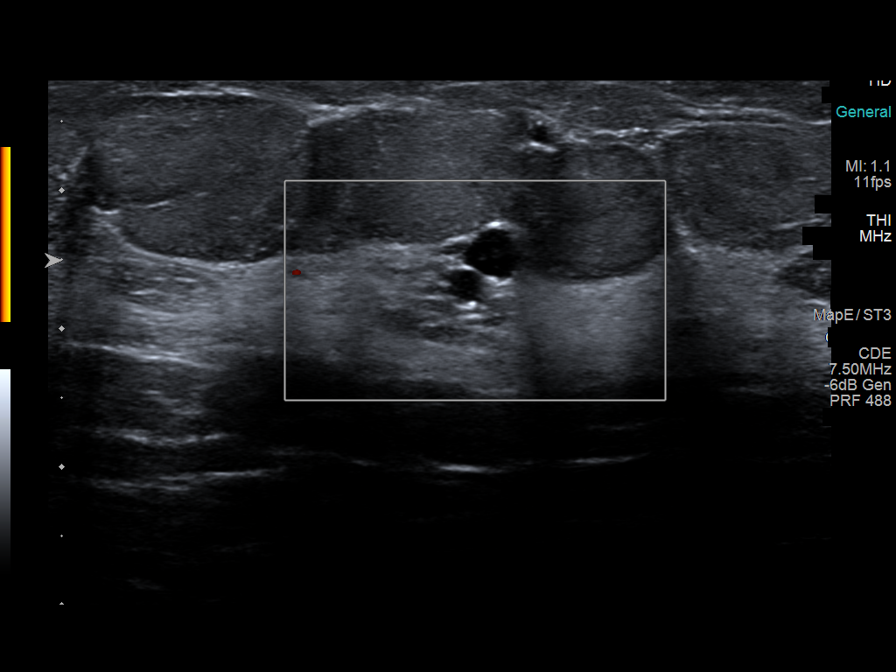
[im 9/9]
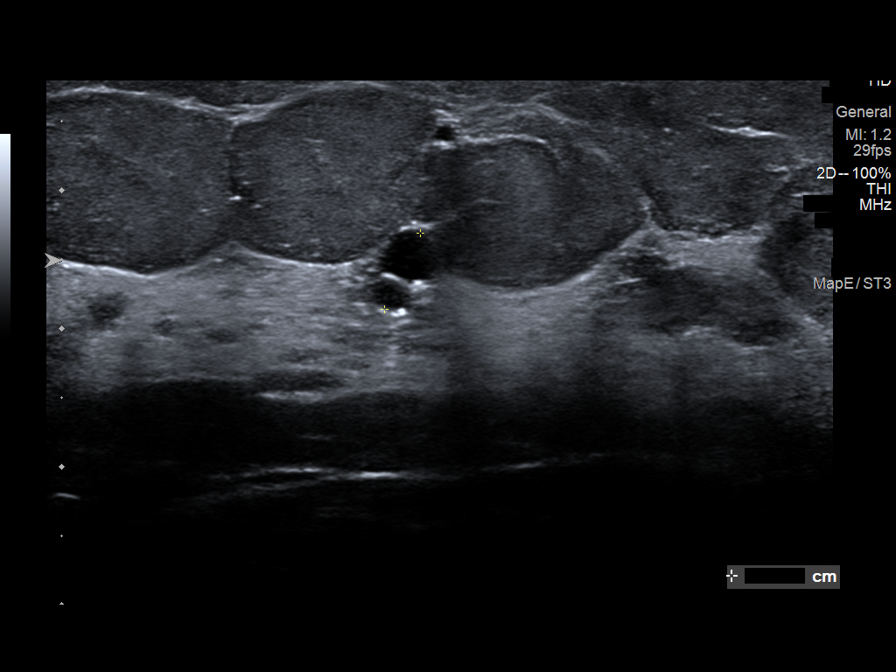

[9 of 9 positions shown; findings below may reference images not displayed]

ACR Breast Density Category c: The breast tissue is heterogeneously
dense, which may obscure small masses.
FINDINGS: Additional tomograms were performed of the left breast. The
initially questioned possible left breast mass is not definitely
identified on the additional imaging however dense fibroglandular
tissue in this location may obscure an abnormality.

Targeted ultrasound of the outer left breast was performed. There
are 2 adjacent cysts in the left breast at 2 o'clock 5 cm from
nipple measuring 0.4 x 0.4 x 0.4 cm and 0.2 x 0.2 x 0.3 cm. These
small cysts are felt to correspond with the mass seen on recent
screening mammography. No suspicious masses or abnormality seen in
the outer left breast.
IMPRESSION: No findings of malignancy in the left breast.

RECOMMENDATION:
Screening mammogram in one year.(Code:OW-[id number])

I have discussed the findings and recommendations with the patient.
If applicable, a reminder letter will be sent to the patient
regarding the next appointment.

BI-RADS CATEGORY  2: Benign.

## 2022-04-21 ENCOUNTER — Other Ambulatory Visit (HOSPITAL_COMMUNITY)
Admission: RE | Admit: 2022-04-21 | Discharge: 2022-04-21 | Disposition: A | Discharge status: Home / Self Care | Payer: 59 | Source: Ambulatory Visit | Attending: Obstetrics | Admitting: Obstetrics

## 2022-04-21 ENCOUNTER — Ambulatory Visit (INDEPENDENT_AMBULATORY_CARE_PROVIDER_SITE_OTHER): Payer: 59 | Admitting: Obstetrics

## 2022-04-21 ENCOUNTER — Encounter: Payer: Self-pay | Admitting: Obstetrics

## 2022-04-21 VITALS — BP 117/81 | HR 90 | Ht 66.0 in | Wt 220.0 lb

## 2022-04-21 DIAGNOSIS — N3946 Mixed incontinence: Secondary | ICD-10-CM | POA: Diagnosis not present

## 2022-04-21 DIAGNOSIS — Z01419 Encounter for gynecological examination (general) (routine) without abnormal findings: Secondary | ICD-10-CM | POA: Diagnosis not present

## 2022-04-21 DIAGNOSIS — N898 Other specified noninflammatory disorders of vagina: Secondary | ICD-10-CM

## 2022-04-21 DIAGNOSIS — Z124 Encounter for screening for malignant neoplasm of cervix: Secondary | ICD-10-CM | POA: Insufficient documentation

## 2022-04-21 DIAGNOSIS — N951 Menopausal and female climacteric states: Secondary | ICD-10-CM

## 2022-04-21 LAB — POCT URINALYSIS DIPSTICK OB
Bilirubin, UA: NEGATIVE
Blood, UA: NEGATIVE
Glucose, UA: NEGATIVE
Ketones, UA: NEGATIVE
Leukocytes, UA: NEGATIVE
Nitrite, UA: NEGATIVE
POC,PROTEIN,UA: NEGATIVE
Spec Grav, UA: >=1.03 — AB (ref 1.010–1.025)
Urobilinogen, UA: 0.2 E.U./dL
pH, UA: 5 (ref 5.0–8.0)

## 2022-04-21 MED ORDER — ESTRADIOL 0.0375 MG/24HR TD PTWK: 0.0375 mg | MEDICATED_PATCH | TRANSDERMAL | 11 refills | Status: AC

## 2022-04-21 MED ORDER — PROGESTERONE MICRONIZED 100 MG PO CAPS
ORAL_CAPSULE | ORAL | 11 refills | Status: AC
Start: 2022-04-21 — End: ?

## 2022-04-21 NOTE — Addendum Note (Signed)
Addended by: Drenda Freeze on: 04/21/2022 11:08 AM   Modules accepted: Orders

## 2022-04-21 NOTE — Progress Notes (Signed)
Gynecology Annual Exam  PCP: Glendon Axe, MD  Chief Complaint:  Chief Complaint  Patient presents with   Gynecologic Exam    Hot flashes   Urinary Tract Infection    Urinary burning    History of Present Illness:Patient is a 52 y.o. Elizabeth Terry presents for annual exam. The patient has been struggling with hot flashes for over a year. She was prescribed estrogen patches last year but does not know the dose, and shares that "they did not work". She was not placed on progesterone then. Hx of three SVDS. She also shares a long term problem with leaking urine when coughing or sneezing, and when she feel the urge to void, she needs to get to the bathroom  quickly.  LMP: Patient's last menstrual period was 01/08/2021 (exact date).  The patient is sexually active. She denies dyspareunia.  The patient does perform self breast exams.  There is no notable family history of breast or ovarian cancer in her family.  The patient wears seatbelts: yes.   The patient has regular exercise: no.  She has had knee surgery and is not able to work or stand for long periods.  The patient denies current symptoms of depression.     Review of Systems: Review of Systems  Constitutional: Negative.   HENT: Negative.    Eyes: Negative.   Respiratory: Negative.    Cardiovascular: Negative.   Genitourinary:  Positive for urgency.       Ureg incontinence and stress incontinence  Musculoskeletal: Negative.   Skin: Negative.   Neurological: Negative.   Endo/Heme/Allergies:        Struggles with hot flashes.  Psychiatric/Behavioral: Negative.      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/2018   Menorrhagia 11/01/2017   Pain in pelvis 11/01/2017   Polyp at  cervical os 11/01/2017   Gastroesophageal reflux disease without esophagitis 09/29/2016   Intermittent vertigo 09/29/2016   Left hand tendonitis 09/29/2016   Essential hypertension 07/23/2016   Hyperlipidemia 07/23/2016   Vertigo 07/23/2016   Vitamin D deficiency 07/23/2016    Past Surgical History:  Past Surgical History:  Procedure Laterality Date   COLONOSCOPY WITH PROPOFOL N/A 06/16/2020   Procedure: COLONOSCOPY WITH PROPOFOL;  Surgeon: [name], MD;  Location: [place];  Service: Endoscopy;  Laterality: N/A;  priority 4   HYSTEROSCOPY  03/17/2012   KNEE ARTHROSCOPY WITH MENISCAL REPAIR Right 06/04/2019   Procedure: RIGHT KNEE ARTHROSCOPY, MEDIAL MENISCUS ROOT REPAIR, CHONDROPLASTY, AND OPEN HIGH TIBIAL OSTEOTOMY;  Surgeon: Leim Fabry, MD;  Location: [facility];  Service: Orthopedics;  Laterality: Right;   POLYPECTOMY  03/17/2012   cervical   SALPINGECTOMY Left    for left ectopic   TUBAL LIGATION     done in 1994   tubal reversal      Gynecologic History:  Patient's last menstrual period was 01/08/2021 (exact date). Last Pap: Results were: NILM no abnormalities  Last mammogram: 2023 Results were: BI-RAD I  Obstetric HistoryCJ:[id number]  Family History:  Family History  Problem Relation Age of Onset   Hypertension Mother    Hyperlipidemia Mother    Kidney Stones Mother    Liver cancer Father        died in his  19s   Prostate cancer Father    Colon cancer Father    Hypertension Maternal Grandmother     Social History:  Social History   Socioeconomic History   Marital status: Married    Spouse name: thomas   Number of children: 3   Years of education: Not on file   Highest education level: Not on file  Occupational History   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  Tobacco Use   Smoking status: Never   Smokeless tobacco: Never  Vaping Use   Vaping Use: Never used  Substance and Sexual Activity   Alcohol use: Yes     Comment: occasional glass of wine   Drug use: No   Sexual activity: Not Currently    Birth control/protection: None  Other Topics Concern   Not on file  Social History Narrative   Patient lives with husband, 2 daughters and her mother   2 story house   Social Determinants of Health   Financial Resource Strain: Not on file  Food Insecurity: Not on file  Transportation Needs: Not on file  Physical Activity: Not on file  Stress: Not on file  Social Connections: Not on file  Intimate Partner Violence: Not on file    Allergies:  Allergies  Allergen Reactions   Grapefruit Extract Swelling    HIGH AMOUNTS OF CITRIC JUICES WILL CAUSE THIS   Aspirin Nausea And Vomiting    ALL ASPIRIN PRODUCTS    Medications: Prior to Admission medications   Medication Sig Start Date End Date Taking? Authorizing Provider  cyclobenzaprine (FLEXERIL) 5 MG tablet Take 1-2 tablets (5-10 mg total) by mouth 3 (three) times daily as needed for muscle spasms. 08/19/20  Yes Duanne Guess, PA-C  hydrochlorothiazide (HYDRODIURIL) 12.5 MG tablet Take 1 tablet (12.5 mg total) by mouth daily. 07/23/16  Yes Dalia Heading, CNM  Multiple Vitamins-Minerals (MULTIVITAMIN WITH MINERALS) tablet Take 1 tablet by mouth daily.   Yes [provider]  omeprazole (PRILOSEC OTC) 20 MG tablet Take by mouth. 02/17/21  Yes [provider]  potassium chloride (KLOR-CON) 10 MEQ tablet Take by mouth. 09/07/19  Yes [provider]  Vitamin D, Ergocalciferol, (DRISDOL) 50000 units CAPS capsule Take 50,000 Units by mouth every 7 (seven) days. Has not had any of these to take for a while now.   Yes [provider]  amLODipine (NORVASC) 5 MG tablet Take by mouth. 02/17/21 02/17/22  [provider]    Physical Exam Vitals: Height 5' 6"$  (1.676 m), weight 220 lb (99.8 kg), last menstrual period 01/08/2021.  General: NAD HEENT: normocephalic, anicteric Thyroid: no enlargement, no palpable  nodules Pulmonary: No increased work of breathing, CTAB Cardiovascular: RRR, distal pulses 2+ Breast: Breast symmetrical, no tenderness, no palpable nodules or masses, no skin or nipple retraction present, no nipple discharge.  No axillary or supraclavicular lymphadenopathy. Abdomen: NABS, soft, non-tender, non-distended.  Umbilicus without lesions.  No hepatomegaly, splenomegaly or masses palpable. No evidence of hernia  Genitourinary:  External: Normal external female genitalia.  Normal urethral meatus, normal Bartholin's and Skene's glands.    Vagina: Normal vaginal mucosa, no evidence of prolapse.    Cervix: Grossly normal in appearance, no bleeding  Uterus: Non-enlarged, mobile, normal contour.  No CMT  Adnexa: ovaries non-enlarged, no adnexal masses  Rectal: deferred  Lymphatic: no evidence of inguinal lymphadenopathy Extremities: no edema, erythema, or tenderness Neurologic: Grossly intact Psychiatric: mood appropriate, affect full  Female chaperone present for pelvic  and breast  portions of the physical exam     Assessment: 52 y.o. Elizabeth Terry routine annual exam  Plan: Problem List Items Addressed This Visit   None   1) Mammogram - recommend yearly screening mammogram.  Mammogram Is up to date  2) STI screening   aptima offered to address some UTI and odor symptoms offered and accepted  3) ASCCP guidelines and rational discussed.  Patient opts for every 5 years screening interval  4) Osteoporosis  - per USPTF routine screening DEXA at age 25 This will be ordered by her PCP  Consider FDA-approved medical therapies in postmenopausal women and men aged 68 years and older, based on the following: a) A hip or vertebral (clinical or morphometric) fracture b) T-score ? -2.5 at the femoral neck or spine after appropriate evaluation to exclude secondary causes C) Low bone mass (T-score between -1.0 and -2.5 at the femoral neck or spine) and a 10-year probability of a hip fracture  ? 3% or a 10-year probability of a major osteoporosis-related fracture ? 20% based on the [place]-adapted WHO algorithm   5) Routine healthcare maintenance including cholesterol, diabetes screening discussed managed by PCP  6) Colonoscopy Per the PCP.  Screening recommended starting at age 35 for average risk individuals, age 58 for individuals deemed at increased risk (including African Americans) and recommended to continue until age 3.  For patient age 66-85 individualized approach is recommended.  Gold standard screening is via colonoscopy, Cologuard screening is an acceptable alternative for patient unwilling or unable to undergo colonoscopy.  "Colorectal cancer screening for average?risk adults: 2018 guideline update from the American Cancer Society"CA: A Cancer Journal for Clinicians: Aug 04, 2016   7) No follow-ups on file.  I have put in a referral to Urogyn. Aptima and pap today. Started on weekly estrogen patches to address her daily hot flashes. Oral progesterone RX. F/U in 2 months to review her HRT.    Elizabeth Terry, CNM  04/21/2022 9:31 AM       Elizabeth Terry, [facility] 04/21/2022, 8:50 AM

## 2022-04-22 LAB — CERVICOVAGINAL ANCILLARY ONLY
Bacterial Vaginitis (gardnerella): NEGATIVE
Candida Glabrata: NEGATIVE
Candida Vaginitis: NEGATIVE
Comment: NEGATIVE
Comment: NEGATIVE
Comment: NEGATIVE

## 2022-04-23 LAB — CYTOLOGY - PAP
Comment: NEGATIVE
Diagnosis: NEGATIVE
Diagnosis: REACTIVE
High risk HPV: NEGATIVE

## 2022-04-24 ENCOUNTER — Encounter: Payer: Self-pay | Admitting: Obstetrics

## 2022-08-09 ENCOUNTER — Ambulatory Visit: Payer: Self-pay | Admitting: Urology

## 2022-09-27 ENCOUNTER — Encounter: Payer: Self-pay | Admitting: Urology

## 2022-09-27 ENCOUNTER — Ambulatory Visit: Payer: 59 | Admitting: Urology

## 2022-09-27 VITALS — BP 126/82 | HR 87 | Ht 66.0 in | Wt 218.0 lb

## 2022-09-27 DIAGNOSIS — N3946 Mixed incontinence: Secondary | ICD-10-CM | POA: Diagnosis not present

## 2022-09-27 MED ORDER — GEMTESA 75 MG PO TABS: 1.0000 | ORAL_TABLET | Freq: Every day | ORAL | Status: AC

## 2022-09-27 NOTE — Progress Notes (Signed)
09/27/2022 9:12 AM   Elizabeth Terry 1971-01-26 [id number]  Referring provider: Mirna Mires, CNM [address] [place],  [place] [address]  Chief Complaint  Patient presents with   Urinary Incontinence   New Patient (Initial Visit)    HPI: I was consulted to assess the patient's urinary incontinence.  Some of the details were a bit challenging but it appears that she primarily has moisture.  She says she did not leak but then did admit that sometimes she leaks a small amount with coughing sneezing but not bending lifting.  She says she can have some urge incontinence if she tries to hold it too long.  She is dampness first thing in the morning.  She does not wear a pad.  She voids every 2 or 3 hours but can hold it longer.  She gets up once or less at night.  Flow was good and she double void a small amount  No hysterectomy.  Prone to constipation.  No history of bladder surgery kidney stones and no neurologic issues   PMH: Past Medical History:  Diagnosis Date   Anemia    vitamin d deficiency   Arthritis    Ectopic pregnancy    tube removed   GERD (gastroesophageal reflux disease)    Hyperlipidemia    Hypertension    Vertigo    Vitamin D deficiency     Surgical History: Past Surgical History:  Procedure Laterality Date   COLONOSCOPY WITH PROPOFOL N/A 06/16/2020   Procedure: COLONOSCOPY WITH PROPOFOL;  Surgeon: Midge Minium, MD;  Location[facility];  Service: Endoscopy;  Laterality: N/A;  priority 4   HYSTEROSCOPY  03/17/2012   KNEE ARTHROSCOPY WITH MENISCAL REPAIR Right 06/04/2019   Procedure: RIGHT KNEE ARTHROSCOPY, MEDIAL MENISCUS ROOT REPAIR, CHONDROPLASTY, AND OPEN HIGH TIBIAL OSTEOTOMY;  Surgeon: Signa Kell, MD;  Location: [facility];  Service: Orthopedics;  Laterality: Right;   POLYPECTOMY  03/17/2012   cervical   SALPINGECTOMY Left    for left ectopic   TUBAL LIGATION     done in 1994   tubal reversal      Home Medications:   Allergies as of 09/27/2022       Reactions   Grapefruit Extract Swelling   HIGH AMOUNTS OF CITRIC JUICES WILL CAUSE THIS   Aspirin Nausea And Vomiting   ALL ASPIRIN PRODUCTS        Medication List        Accurate as of September 27, 2022  9:12 AM. If you have any questions, ask your nurse or doctor.          amLODipine 5 MG tablet Commonly known as: NORVASC Take by mouth.   cyclobenzaprine 5 MG tablet Commonly known as: FLEXERIL Take 1-2 tablets (5-10 mg total) by mouth 3 (three) times daily as needed for muscle spasms.   estradiol 0.0375 mg/24hr patch Commonly known as: CLIMARA - Dosed in mg/24 hr Place 1 patch (0.0375 mg total) onto the skin once a week.   hydrochlorothiazide 12.5 MG tablet Commonly known as: HYDRODIURIL Take 1 tablet (12.5 mg total) by mouth daily.   multivitamin with minerals tablet Take 1 tablet by mouth daily.   omeprazole 20 MG tablet Commonly known as: PRILOSEC OTC Take by mouth.   potassium chloride 10 MEQ tablet Commonly known as: KLOR-CON M Take by mouth.   progesterone 100 MG capsule Commonly known as: PROMETRIUM Place one capsule vaginally at bedtime   Vitamin D (Ergocalciferol) 1.25 MG (50000 UNIT) Caps  capsule Commonly known as: DRISDOL Take 50,000 Units by mouth every 7 (seven) days. Has not had any of these to take for a while now.        Allergies:  Allergies  Allergen Reactions   Grapefruit Extract Swelling    HIGH AMOUNTS OF CITRIC JUICES WILL CAUSE THIS   Aspirin Nausea And Vomiting    ALL ASPIRIN PRODUCTS    Family History: Family History  Problem Relation Age of Onset   Hypertension Mother    Hyperlipidemia Mother    Kidney Stones Mother    Liver cancer Father        died in his 65s   Prostate cancer Father    Colon cancer Father    Hypertension Maternal Grandmother     Social History:  reports that she has never smoked. She has never been exposed to tobacco smoke. She has never used smokeless  tobacco. She reports current alcohol use. She reports that she does not use drugs.  ROS:                                        Physical Exam: LMP 01/08/2021 (Exact Date)   Constitutional:  Alert and oriented, No acute distress. HEENT: [place] AT, moist mucus membranes.  Trachea midline, no masses. Cardiovascular: No clubbing, cyanosis, or edema. Respiratory: Normal respiratory effort, no increased work of breathing. GI: Abdomen is soft, nontender, nondistended, no abdominal masses GU: On pelvic examination well supported bladder neck and no stress incontinence with moderate cough.  No prolapse. Skin: No rashes, bruises or suspicious lesions. Lymph: No cervical or inguinal adenopathy. Neurologic: Grossly intact, no focal deficits, moving all 4 extremities. Psychiatric: Normal mood and affect.  Laboratory Data: Lab Results  Component Value Date   WBC 10.4 06/05/2019   HGB 9.5 (L) 06/05/2019   HCT 30.0 (L) 06/05/2019   MCV 79.8 (L) 06/05/2019   PLT 409 (H) 06/05/2019    Lab Results  Component Value Date   CREATININE 0.90 06/05/2019    No results found for: "PSA"  No results found for: "TESTOSTERONE"  No results found for: "HGBA1C"  Urinalysis    Component Value Date/Time   GLUCOSEU Negative 04/21/2022 1107   BILIRUBINUR neg 04/21/2022 1107   PROTEINUR Negative 08/17/2017 0839   UROBILINOGEN 0.2 04/21/2022 1107   NITRITE neg 04/21/2022 1107   LEUKOCYTESUR Negative 04/21/2022 1107    Pertinent Imaging: Urine reviewed.  Urine sent for culture.  Chart reviewed  Assessment & Plan: Patient has mixed incontinence with dampness or moisture.  Has minimal frequency and nocturia.  Role of medical and behavioral therapy discussed.  Patient did not wish to see physical therapy.  Although symptoms are less severe she will try Gemtesa samples and prescription.  We can try other medications in the future.  I mention urodynamics with no detail.  1. Mixed  incontinence  - Urinalysis, Complete   No follow-ups on file.  Martina Sinner, MD  [facility] [address] [place], [place] [address] [phone]

## 2022-09-28 LAB — URINALYSIS, COMPLETE
Bilirubin, UA: NEGATIVE
Glucose, UA: NEGATIVE
Ketones, UA: NEGATIVE
Leukocytes,UA: NEGATIVE
Nitrite, UA: NEGATIVE
Protein,UA: NEGATIVE
RBC, UA: NEGATIVE
Specific Gravity, UA: 1.025 (ref 1.005–1.030)
Urobilinogen, Ur: 0.2 mg/dL (ref 0.2–1.0)
pH, UA: 5.5 (ref 5.0–7.5)

## 2022-09-28 LAB — MICROSCOPIC EXAMINATION: Epithelial Cells (non renal): >10 /hpf — AB (ref 0–10)

## 2022-09-29 LAB — CULTURE, URINE COMPREHENSIVE

## 2022-10-30 DIAGNOSIS — Z809 Family history of malignant neoplasm, unspecified: Secondary | ICD-10-CM | POA: Diagnosis not present

## 2022-10-30 DIAGNOSIS — Z6834 Body mass index (BMI) 34.0-34.9, adult: Secondary | ICD-10-CM | POA: Diagnosis not present

## 2022-10-30 DIAGNOSIS — I10 Essential (primary) hypertension: Secondary | ICD-10-CM | POA: Diagnosis not present

## 2022-10-30 DIAGNOSIS — Z8249 Family history of ischemic heart disease and other diseases of the circulatory system: Secondary | ICD-10-CM | POA: Diagnosis not present

## 2022-10-30 DIAGNOSIS — M1990 Unspecified osteoarthritis, unspecified site: Secondary | ICD-10-CM | POA: Diagnosis not present

## 2022-10-30 DIAGNOSIS — E785 Hyperlipidemia, unspecified: Secondary | ICD-10-CM | POA: Diagnosis not present

## 2022-10-30 DIAGNOSIS — E669 Obesity, unspecified: Secondary | ICD-10-CM | POA: Diagnosis not present

## 2022-10-30 DIAGNOSIS — E119 Type 2 diabetes mellitus without complications: Secondary | ICD-10-CM | POA: Diagnosis not present

## 2022-10-30 DIAGNOSIS — Z886 Allergy status to analgesic agent status: Secondary | ICD-10-CM | POA: Diagnosis not present

## 2022-10-30 DIAGNOSIS — K219 Gastro-esophageal reflux disease without esophagitis: Secondary | ICD-10-CM | POA: Diagnosis not present

## 2022-10-30 DIAGNOSIS — Z823 Family history of stroke: Secondary | ICD-10-CM | POA: Diagnosis not present

## 2022-10-30 DIAGNOSIS — K5900 Constipation, unspecified: Secondary | ICD-10-CM | POA: Diagnosis not present

## 2022-11-05 DIAGNOSIS — R8290 Unspecified abnormal findings in urine: Secondary | ICD-10-CM | POA: Diagnosis not present

## 2022-11-18 ENCOUNTER — Other Ambulatory Visit: Payer: Self-pay | Admitting: Internal Medicine

## 2022-11-18 DIAGNOSIS — Z1231 Encounter for screening mammogram for malignant neoplasm of breast: Secondary | ICD-10-CM

## 2022-11-22 ENCOUNTER — Encounter: Payer: Self-pay | Admitting: Urology

## 2022-11-22 ENCOUNTER — Ambulatory Visit: Payer: 59 | Admitting: Urology

## 2022-12-07 ENCOUNTER — Ambulatory Visit
Admission: RE | Admit: 2022-12-07 | Discharge: 2022-12-07 | Disposition: A | Discharge status: Home / Self Care | Payer: 59 | Source: Ambulatory Visit | Attending: Internal Medicine | Admitting: Internal Medicine

## 2022-12-07 DIAGNOSIS — Z1231 Encounter for screening mammogram for malignant neoplasm of breast: Secondary | ICD-10-CM | POA: Diagnosis present

## 2022-12-13 ENCOUNTER — Other Ambulatory Visit: Payer: Self-pay | Admitting: Internal Medicine

## 2022-12-13 DIAGNOSIS — R928 Other abnormal and inconclusive findings on diagnostic imaging of breast: Secondary | ICD-10-CM

## 2022-12-14 ENCOUNTER — Ambulatory Visit
Admission: RE | Admit: 2022-12-14 | Discharge: 2022-12-14 | Disposition: A | Discharge status: Home / Self Care | Payer: 59 | Source: Ambulatory Visit | Attending: Internal Medicine | Admitting: Internal Medicine

## 2022-12-14 DIAGNOSIS — R928 Other abnormal and inconclusive findings on diagnostic imaging of breast: Secondary | ICD-10-CM | POA: Diagnosis present

## 2023-02-07 DIAGNOSIS — H5213 Myopia, bilateral: Secondary | ICD-10-CM | POA: Diagnosis not present

## 2023-11-09 DIAGNOSIS — R7989 Other specified abnormal findings of blood chemistry: Secondary | ICD-10-CM | POA: Diagnosis not present

## 2023-11-09 DIAGNOSIS — R7309 Other abnormal glucose: Secondary | ICD-10-CM | POA: Diagnosis not present

## 2023-11-09 DIAGNOSIS — I10 Essential (primary) hypertension: Secondary | ICD-10-CM | POA: Diagnosis not present

## 2023-11-09 DIAGNOSIS — R42 Dizziness and giddiness: Secondary | ICD-10-CM | POA: Diagnosis not present

## 2023-11-09 DIAGNOSIS — Z0000 Encounter for general adult medical examination without abnormal findings: Secondary | ICD-10-CM | POA: Diagnosis not present

## 2023-11-09 DIAGNOSIS — E1165 Type 2 diabetes mellitus with hyperglycemia: Secondary | ICD-10-CM | POA: Diagnosis not present

## 2023-11-09 DIAGNOSIS — R300 Dysuria: Secondary | ICD-10-CM | POA: Diagnosis not present

## 2023-11-16 ENCOUNTER — Other Ambulatory Visit: Payer: Self-pay | Admitting: Internal Medicine

## 2023-11-16 DIAGNOSIS — Z1231 Encounter for screening mammogram for malignant neoplasm of breast: Secondary | ICD-10-CM

## 2023-12-13 ENCOUNTER — Ambulatory Visit
Admission: RE | Admit: 2023-12-13 | Discharge: 2023-12-13 | Disposition: A | Discharge status: Home / Self Care | Source: Ambulatory Visit | Attending: Internal Medicine | Admitting: Internal Medicine

## 2023-12-13 DIAGNOSIS — Z1231 Encounter for screening mammogram for malignant neoplasm of breast: Secondary | ICD-10-CM | POA: Diagnosis present
